# Patient Record
Sex: Male | Born: 1945 | Race: White | Hispanic: No | Marital: Married | Smoking: Former smoker
Health system: Southern US, Community
[De-identification: ages and names within clinical notes are randomized; demographics above are authoritative.]

## PROBLEM LIST (undated history)

## (undated) DIAGNOSIS — E669 Obesity, unspecified: Secondary | ICD-10-CM

## (undated) DIAGNOSIS — K5730 Diverticulosis of large intestine without perforation or abscess without bleeding: Secondary | ICD-10-CM

## (undated) DIAGNOSIS — N400 Enlarged prostate without lower urinary tract symptoms: Secondary | ICD-10-CM

## (undated) DIAGNOSIS — Z7289 Other problems related to lifestyle: Secondary | ICD-10-CM

## (undated) DIAGNOSIS — I499 Cardiac arrhythmia, unspecified: Secondary | ICD-10-CM

## (undated) DIAGNOSIS — I10 Essential (primary) hypertension: Secondary | ICD-10-CM

## (undated) DIAGNOSIS — F1090 Alcohol use, unspecified, uncomplicated: Secondary | ICD-10-CM

## (undated) DIAGNOSIS — E785 Hyperlipidemia, unspecified: Secondary | ICD-10-CM

## (undated) DIAGNOSIS — C61 Malignant neoplasm of prostate: Secondary | ICD-10-CM

## (undated) DIAGNOSIS — G4733 Obstructive sleep apnea (adult) (pediatric): Secondary | ICD-10-CM

## (undated) DIAGNOSIS — M1990 Unspecified osteoarthritis, unspecified site: Secondary | ICD-10-CM

## (undated) DIAGNOSIS — I420 Dilated cardiomyopathy: Secondary | ICD-10-CM

## (undated) DIAGNOSIS — I4892 Unspecified atrial flutter: Secondary | ICD-10-CM

## (undated) DIAGNOSIS — I4891 Unspecified atrial fibrillation: Secondary | ICD-10-CM

## (undated) HISTORY — DX: Obstructive sleep apnea (adult) (pediatric): G47.33

## (undated) HISTORY — DX: Dilated cardiomyopathy: I42.0

## (undated) HISTORY — DX: Unspecified osteoarthritis, unspecified site: M19.90

## (undated) HISTORY — PX: COLONOSCOPY: SHX5424

## (undated) HISTORY — DX: Diverticulosis of large intestine without perforation or abscess without bleeding: K57.30

## (undated) HISTORY — PX: TONSILLECTOMY: SUR1361

## (undated) HISTORY — DX: Essential (primary) hypertension: I10

---

## 1979-07-13 HISTORY — PX: OTHER SURGICAL HISTORY: SHX169

## 1989-07-12 HISTORY — PX: CARPAL TUNNEL RELEASE: SHX101

## 2011-02-28 HISTORY — PX: PROSTATE BIOPSY: SHX241

## 2011-05-31 ENCOUNTER — Other Ambulatory Visit (HOSPITAL_COMMUNITY): Payer: Self-pay | Admitting: Urology

## 2011-05-31 DIAGNOSIS — Z8546 Personal history of malignant neoplasm of prostate: Secondary | ICD-10-CM

## 2011-06-18 ENCOUNTER — Ambulatory Visit (HOSPITAL_COMMUNITY)
Admission: RE | Admit: 2011-06-18 | Discharge: 2011-06-18 | Disposition: A | Discharge status: Home / Self Care | Payer: MEDICARE | Source: Ambulatory Visit | Attending: Urology | Admitting: Urology

## 2011-06-18 DIAGNOSIS — K5730 Diverticulosis of large intestine without perforation or abscess without bleeding: Secondary | ICD-10-CM | POA: Insufficient documentation

## 2011-06-18 DIAGNOSIS — Z8546 Personal history of malignant neoplasm of prostate: Secondary | ICD-10-CM

## 2011-06-18 DIAGNOSIS — C61 Malignant neoplasm of prostate: Secondary | ICD-10-CM | POA: Insufficient documentation

## 2011-06-18 MED ORDER — GADOBENATE DIMEGLUMINE 529 MG/ML IV SOLN
20.0000 mL | Freq: Once | INTRAVENOUS | Status: AC | PRN
  Administered 2011-06-18: 20 mL via INTRAVENOUS

## 2012-02-05 ENCOUNTER — Ambulatory Visit
Admission: RE | Admit: 2012-02-05 | Discharge: 2012-02-05 | Disposition: A | Discharge status: Home / Self Care | Payer: Medicare Other | Source: Ambulatory Visit | Attending: Radiation Oncology | Admitting: Radiation Oncology

## 2012-02-05 ENCOUNTER — Encounter: Payer: Self-pay | Admitting: Radiation Oncology

## 2012-02-05 VITALS — BP 126/81 | HR 83 | Temp 97.0°F | Resp 18 | Ht 70.0 in | Wt 221.1 lb

## 2012-02-05 DIAGNOSIS — E7800 Pure hypercholesterolemia, unspecified: Secondary | ICD-10-CM | POA: Insufficient documentation

## 2012-02-05 DIAGNOSIS — Z7982 Long term (current) use of aspirin: Secondary | ICD-10-CM | POA: Insufficient documentation

## 2012-02-05 DIAGNOSIS — I10 Essential (primary) hypertension: Secondary | ICD-10-CM | POA: Insufficient documentation

## 2012-02-05 DIAGNOSIS — C61 Malignant neoplasm of prostate: Secondary | ICD-10-CM | POA: Insufficient documentation

## 2012-02-05 DIAGNOSIS — Z87891 Personal history of nicotine dependence: Secondary | ICD-10-CM | POA: Insufficient documentation

## 2012-02-05 DIAGNOSIS — Z79899 Other long term (current) drug therapy: Secondary | ICD-10-CM | POA: Insufficient documentation

## 2012-02-05 NOTE — Progress Notes (Signed)
66 year old male. Married.  Prostate biopsy done 02/28/11 revealed Gleason 3+3=6 adenocarcinoma of the prostate in 6 out of 12 biopsy cores. Right apex (50%), R lateral apex (60%), R mid (20%) R lateral mid (25%), R base (35%), R lateral base (80%).  cT2a NxMx Prostate volume 54.8 cc  09/2008 PSA  3.11 12/2009   PSA  4.06 01/01/11  PSA  4.76  NKDA No indication of a pacemaker No hx of radiation thearpy

## 2012-02-05 NOTE — Progress Notes (Signed)
Complete PATIENT MEASURE OF DISTRESS worksheet with a score of 0 submitted to social work. Also, complete NUTRITION RISK SCREEN without concerns submitted to Zenovia Jarred, RD.

## 2012-02-05 NOTE — Progress Notes (Signed)
Encounter addended by: Maryln Gottron, MD on: 02/05/2012  9:57 PM<BR>     Documentation filed: Notes Section

## 2012-02-05 NOTE — Progress Notes (Signed)
See progress note under physician encounter. 

## 2012-02-05 NOTE — Progress Notes (Signed)
Patient presents to the clinic today accompanied by his wife of 32 years for a new consult with Dr. Dayton Scrape reference prostate cancer. Patient is alert and oriented to person, place, and time. No distress noted.Steady gait noted. Pleasant affect noted. Patient reports right shoulder, left hip and left knee pain related effects of arthritis. Patient denies nausea, vomiting, headache or diarrhea. Patient reports that his weight has remained stable. Patient reports eating and sleeping without difficulty. Patient reports occasional urgency and weak stream. Patient denies burning with urination or hematuria. IPSS of 2 noted. Reported all findings to Dr. Dayton Scrape.
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
## 2012-02-07 NOTE — Progress Notes (Signed)
Encounter addended by: Agnes Lawrence, RN on: 02/07/2012 10:59 AM<BR>     Documentation filed: Charges VN, Inpatient Patient Education

## 2012-03-23 ENCOUNTER — Other Ambulatory Visit: Payer: Self-pay | Admitting: Urology

## 2012-07-07 DIAGNOSIS — C61 Malignant neoplasm of prostate: Secondary | ICD-10-CM | POA: Insufficient documentation

## 2012-07-16 ENCOUNTER — Other Ambulatory Visit: Payer: Self-pay | Admitting: Urology

## 2012-07-17 ENCOUNTER — Encounter (HOSPITAL_COMMUNITY): Payer: Self-pay | Admitting: Pharmacy Technician

## 2012-07-23 ENCOUNTER — Encounter (HOSPITAL_COMMUNITY)
Admission: RE | Admit: 2012-07-23 | Discharge: 2012-07-23 | Disposition: A | Discharge status: Home / Self Care | Payer: Medicare Other | Source: Ambulatory Visit | Attending: Urology | Admitting: Urology

## 2012-07-23 ENCOUNTER — Encounter (HOSPITAL_COMMUNITY): Payer: Self-pay

## 2012-07-23 ENCOUNTER — Ambulatory Visit (HOSPITAL_COMMUNITY)
Admission: RE | Admit: 2012-07-23 | Discharge: 2012-07-23 | Disposition: A | Discharge status: Home / Self Care | Payer: Medicare Other | Source: Ambulatory Visit | Attending: Urology | Admitting: Urology

## 2012-07-23 DIAGNOSIS — Z01812 Encounter for preprocedural laboratory examination: Secondary | ICD-10-CM | POA: Insufficient documentation

## 2012-07-23 DIAGNOSIS — C61 Malignant neoplasm of prostate: Secondary | ICD-10-CM | POA: Insufficient documentation

## 2012-07-23 DIAGNOSIS — Z01810 Encounter for preprocedural cardiovascular examination: Secondary | ICD-10-CM | POA: Insufficient documentation

## 2012-07-23 LAB — CBC
HCT: 45.8 % (ref 39.0–52.0)
Hemoglobin: 15.7 g/dL (ref 13.0–17.0)
MCH: 29.8 pg (ref 26.0–34.0)
MCHC: 34.3 g/dL (ref 30.0–36.0)

## 2012-07-23 LAB — BASIC METABOLIC PANEL
BUN: 14 mg/dL (ref 6–23)
Creatinine, Ser: 0.79 mg/dL (ref 0.50–1.35)
GFR calc non Af Amer: >90 mL/min (ref >90)
Glucose, Bld: 104 mg/dL — ABNORMAL HIGH (ref 70–99)
Potassium: 4.4 mEq/L (ref 3.5–5.1)

## 2012-07-23 NOTE — Pre-Procedure Instructions (Signed)
Reviewed with patient pre-op instructions using Teach back method. 

## 2012-07-23 NOTE — Progress Notes (Signed)
07/23/12 1011  OBSTRUCTIVE SLEEP APNEA  Have you ever been diagnosed with sleep apnea through a sleep study? No  Do you snore loudly (loud enough to be heard through closed doors)?  1  Do you often feel tired, fatigued, or sleepy during the daytime? 0  Has anyone observed you stop breathing during your sleep? 0  Do you have, or are you being treated for high blood pressure? 1  BMI more than 35 kg/m2? 0  Age over 68 years old? 1  Neck circumference greater than 40 cm/18 inches? 0  Gender: 1  Obstructive Sleep Apnea Score 4   Score 4 or greater  Updated health history

## 2012-07-23 NOTE — Patient Instructions (Signed)
20 Travis Chase  07/23/2012   Your procedure is scheduled on:  Monday 07/27/2012 at 1100 am  Report to [facility] at 0830 AM.  Call this number if you have problems the morning of surgery: [phone]   Remember:FOLLOW BOWEL PREP INSTRUCTIONS FROM DR.BORDEN AS INSTRUCTED   Do not eat food:AFTER SAT. 07/25/2012-FOLLOW CLEAR LIQUID DIET ON SUN. 07/26/2012  May have clear liquids:until Midnight .    Take these medicines the morning of surgery with A SIP OF WATER: NONE   Do not wear jewelry  Do not wear lotions, powders, or perfumes.  Do not shave 48 hours prior to surgery. Men may shave face and neck.  Do not bring valuables to the hospital.  Contacts, dentures or bridgework may not be worn into surgery.  Leave suitcase in the car. After surgery it may be brought to your room.  For patients admitted to the hospital, checkout time is 11:00 AM the day of discharge.       Special Instructions: CHG Shower Use Special Wash: 1/2 bottle night before surgery and 1/2 bottle morning of surgery.   Please read over the following fact sheets that you were given: MRSA Information, Sleep apnea sheet, Incentive spirometry sheet, clear liquid diet                If you have any questions, please call me at [phone] [facility].Georgeanna Lea, Charity fundraiser, BSN

## 2012-07-26 NOTE — H&P (Signed)
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 He has no family history of prostate cancer.  He is relatively healthy with a history of arthritis, hypertension, and dyslipidemia. He initially did not wish to proceed with treatment of curative intent and was interested in surveillance although did not followup as recommended.  He presented again 6 months after his initial diagnosis and was interested in further discussing his options. He has discussed his options with Dr. Dayton Scrape and myself and has elected to proceed with surgical therapy.  TNM stage: cT2a Nx Mx PSA: 4.76 Gleason score: 3+3=6 Biopsy (02/28/11): 6/12 cores -- R apex (50%), R lateral apex (60%), R mid (20%), R lateral mid (25%), R base (35%), R lateral base (80%) Prostate volume: 54.8 cc  MSK nomogram: Indolent disease: 19% OC disease: 84% EPE: 19% SVI: 1% LNI: 1.4% PFS: 97%, 95%  Urinary function: He has a history of BPH and his symptoms are well controlled with Cardura 2 mg. IPSS: 3. Erectile function: He has mild erectile dysfunction and has not required treatment. SHIM score: 20.  Interval history:  He follows up today for further discussion regarding surgical treatment of his prostate cancer. He did inform me a few months ago that he did decide on surgical treatment after contemplating active surveillance as an alternative option. He is scheduled for his prostatectomy next week and follows up today for further discussion regarding this procedure. He has remained in excellent health since his last visit.     Past Medical History Problems  1. History of  Arthritis V13.4 2. History of  Benign Prostatic Hypertrophy 600.00 3. History of  Diverticulosis 562.10 4. History of  Hypercholesterolemia 272.0 5. History of  Hypertension  401.9 6. Prostate Cancer 185  Surgical History Problems  1. History of  Wrist Arthroscopy With Release Of Transverse Carpal Ligament Right  Current Meds 1. Aleve CAPS; Therapy: (Recorded:21Mar2013) to 2. Aspir-81 81 MG Oral Tablet Delayed Release; Therapy: (Recorded:20Mar2012) to 3. Cardura 2 MG Oral Tablet; Therapy: (Recorded:20Mar2012) to 4. CeleBREX CAPS; Therapy: (Recorded:21Mar2012) to 5. Fish Oil Concentrate 1000 MG Oral Capsule; Therapy: (Recorded:20Mar2012) to 6. Ibuprofen CAPS; Therapy: (Recorded:20Mar2012) to 7. Tylenol CAPS; Therapy: (Recorded:20Mar2012) to  Allergies Medication  1. No Known Drug Allergies  Family History Problems  1. Paternal history of  Acute Myocardial Infarction V17.3 2. Paternal history of  Alcohol Abuse 3. Family history of  Death In The Family Father 63; heart attack 4. Family history of  Death In The Family Mother 64 5. Paternal aunt's history of  Diabetes Mellitus V18.0 6. Fraternal history of  Hypertension V17.49 7. Paternal history of  Malignant Melanoma Of The Skin V16.8  Vitals Vital Signs [Data Includes: Last 1 Day]  10Sep2013 11:01AM  BMI Calculated: 30.78 BSA Calculated: 2.15 Height: 5 ft 10 in Weight: 215 lb  Blood Pressure: 103 / 63 Temperature: 98.4 F Heart Rate: 79  Physical Exam Constitutional: Well nourished and well developed . No acute distress.  ENT:. The ears and nose are normal in appearance.  Neck: The appearance of the neck is normal and no neck mass is present.  Pulmonary: No respiratory distress, normal respiratory rhythm and effort and clear bilateral breath sounds.  Cardiovascular: Heart rate and rhythm are normal . No peripheral edema.  Abdomen: The abdomen is  rounded. The abdomen is soft and nontender. No masses are palpated. No CVA tenderness. No hernias are palpable. No hepatosplenomegaly noted.  Rectal: Prostate size is estimated to be 50 g. Normal rectal tone, no rectal masses, prostate is smooth,  symmetric and non-tender.  Skin: Normal skin turgor, no visible rash and no visible skin lesions.  Neuro/Psych:. Mood and affect are appropriate.    Results/Data Urine [Data Includes: Last 1 Day]   10Sep2013 COLOR YELLOW  APPEARANCE CLEAR  SPECIFIC GRAVITY 1.015  pH 7.0  GLUCOSE NEG mg/dL BILIRUBIN NEG  KETONE NEG mg/dL BLOOD NEG  PROTEIN NEG mg/dL UROBILINOGEN 0.2 mg/dL NITRITE NEG  LEUKOCYTE ESTERASE NEG   Plan Health Maintenance (V70.0)  1. UA With REFLEX  Done: 10Sep2013 10:49AM Prostate Cancer (185)  2. Follow-up Schedule Surgery Office  Follow-up  Requested for: 10Sep2013  Discussion/Summary  1. Prostate cancer:   The patient was counseled about the natural history of prostate cancer and the standard treatment options that are available for prostate cancer. It was explained to him how his age and life expectancy, clinical stage, Gleason score, and PSA affect his prognosis, the decision to proceed with additional staging studies, as well as how that information influences recommended treatment strategies. We discussed the roles for active surveillance, radiation therapy, surgical therapy, androgen deprivation, as well as ablative therapy options for the treatment of prostate cancer as appropriate to his individual cancer situation. We discussed the risks and benefits of these options with regard to their impact on cancer control and also in terms of potential adverse events, complications, and impact on quiality of life particularly related to urinary, bowel, and sexual function. The patient was encouraged to ask questions throughout the discussion today and all questions were answered to his stated satisfaction. In addition, the patient was provided with and/or directed to appropriate resources and literature for further education about prostate cancer and treatment options.   We discussed surgical therapy for prostate cancer including the different available surgical approaches.  We discussed, in detail, the risks and expectations of surgery with regard to cancer control, urinary control, and erectile function as well as the expected postoperative recovery process. Additional risks of surgery including but not limited to bleeding, infection, hernia formation, nerve damage, lymphocele formation, bowel/rectal injury potentially necessitating colostomy, damage to the urinary tract resulting in urine leakage, urethral stricture, and the cardiopulmonary risks such as myocardial infarction, stroke, death, venothromboembolism, etc. were explained. The risk of open surgical conversion for robotic/laparoscopic prostatectomy was also discussed.   All of his questions were answered to his stated satisfaction today. He does wish to proceed as planned and will undergo a bilateral nerve sparing robotic-assisted laparoscopic radical prostatectomy. He does understand the possible risk of cancer progression since his biopsy which was over a year ago.    CC: Dr. Mila Palmer  a total of 45 minutes were spent in the overall care of the patient today Amended By: Heloise Purpura; 07/21/2012 11:47 AMEST with 40 minutes in direct face to face consultation. Amended By: Heloise Purpura; 07/21/2012 11:47 AMEST    Signatures Electronically signed by : Heloise Purpura, M.D.; Jul 21 2012 11:47AM

## 2012-07-27 ENCOUNTER — Inpatient Hospital Stay (HOSPITAL_COMMUNITY)
Admission: RE | Admit: 2012-07-27 | Discharge: 2012-07-28 | DRG: 708 | Disposition: A | Discharge status: Home / Self Care | Payer: Medicare Other | Source: Ambulatory Visit | Attending: Urology | Admitting: Urology

## 2012-07-27 ENCOUNTER — Encounter (HOSPITAL_COMMUNITY)
Admission: RE | Disposition: A | Discharge status: Home / Self Care | Payer: Self-pay | Source: Ambulatory Visit | Attending: Urology

## 2012-07-27 ENCOUNTER — Encounter (HOSPITAL_COMMUNITY): Payer: Self-pay

## 2012-07-27 ENCOUNTER — Inpatient Hospital Stay (HOSPITAL_COMMUNITY): Payer: Medicare Other | Admitting: Anesthesiology

## 2012-07-27 ENCOUNTER — Encounter (HOSPITAL_COMMUNITY): Payer: Self-pay | Admitting: Anesthesiology

## 2012-07-27 DIAGNOSIS — E669 Obesity, unspecified: Secondary | ICD-10-CM | POA: Diagnosis present

## 2012-07-27 DIAGNOSIS — I10 Essential (primary) hypertension: Secondary | ICD-10-CM | POA: Diagnosis present

## 2012-07-27 DIAGNOSIS — C61 Malignant neoplasm of prostate: Principal | ICD-10-CM | POA: Diagnosis present

## 2012-07-27 HISTORY — PX: ROBOT ASSISTED LAPAROSCOPIC RADICAL PROSTATECTOMY: SHX5141

## 2012-07-27 LAB — HEMOGLOBIN AND HEMATOCRIT, BLOOD
HCT: 41.5 % (ref 39.0–52.0)
Hemoglobin: 14.3 g/dL (ref 13.0–17.0)

## 2012-07-27 SURGERY — ROBOTIC ASSISTED LAPAROSCOPIC RADICAL PROSTATECTOMY LEVEL 1
Anesthesia: General | Site: Prostate | Wound class: Clean Contaminated

## 2012-07-27 MED ORDER — INFLUENZA VIRUS VACC SPLIT PF IM SUSP
0.5000 mL | INTRAMUSCULAR | Status: AC
  Administered 2012-07-28: 0.5 mL via INTRAMUSCULAR
  Filled 2012-07-27: qty 0.5

## 2012-07-27 MED ORDER — LACTATED RINGERS IV SOLN
INTRAVENOUS | Status: DC | PRN
  Administered 2012-07-27: 11:00:00

## 2012-07-27 MED ORDER — DIPHENHYDRAMINE HCL 12.5 MG/5ML PO ELIX
12.5000 mg | ORAL_SOLUTION | Freq: Four times a day (QID) | ORAL | Status: DC | PRN
  Filled 2012-07-27: qty 10

## 2012-07-27 MED ORDER — BELLADONNA ALKALOIDS-OPIUM 16.2-60 MG RE SUPP
1.0000 | Freq: Four times a day (QID) | RECTAL | Status: DC | PRN
  Administered 2012-07-27: 1 via RECTAL
  Filled 2012-07-27: qty 1

## 2012-07-27 MED ORDER — PROPOFOL 10 MG/ML IV BOLUS
INTRAVENOUS | Status: DC | PRN
  Administered 2012-07-27: 150 mg via INTRAVENOUS

## 2012-07-27 MED ORDER — LACTATED RINGERS IV SOLN
INTRAVENOUS | Status: DC | PRN
  Administered 2012-07-27 (×2): via INTRAVENOUS

## 2012-07-27 MED ORDER — GLYCOPYRROLATE 0.2 MG/ML IJ SOLN
INTRAMUSCULAR | Status: DC | PRN
  Administered 2012-07-27: .6 mg via INTRAVENOUS

## 2012-07-27 MED ORDER — LIDOCAINE HCL (CARDIAC) 20 MG/ML IV SOLN
INTRAVENOUS | Status: DC | PRN
  Administered 2012-07-27: 100 mg via INTRAVENOUS

## 2012-07-27 MED ORDER — CEFAZOLIN SODIUM 1-5 GM-% IV SOLN
1.0000 g | Freq: Three times a day (TID) | INTRAVENOUS | Status: AC
  Administered 2012-07-27 (×2): 1 g via INTRAVENOUS
  Filled 2012-07-27 (×2): qty 50

## 2012-07-27 MED ORDER — BUPIVACAINE-EPINEPHRINE 0.25% -1:200000 IJ SOLN
INTRAMUSCULAR | Status: DC | PRN
  Administered 2012-07-27: 30 mL

## 2012-07-27 MED ORDER — NEOSTIGMINE METHYLSULFATE 1 MG/ML IJ SOLN
INTRAMUSCULAR | Status: DC | PRN
  Administered 2012-07-27: 4 mg via INTRAVENOUS

## 2012-07-27 MED ORDER — PNEUMOCOCCAL VAC POLYVALENT 25 MCG/0.5ML IJ INJ
0.5000 mL | INJECTION | INTRAMUSCULAR | Status: AC
  Administered 2012-07-28: 0.5 mL via INTRAMUSCULAR
  Filled 2012-07-27: qty 0.5

## 2012-07-27 MED ORDER — INDIGOTINDISULFONATE SODIUM 8 MG/ML IJ SOLN
INTRAMUSCULAR | Status: DC | PRN
  Administered 2012-07-27 (×2): 5 mL via INTRAVENOUS

## 2012-07-27 MED ORDER — SUFENTANIL CITRATE 50 MCG/ML IV SOLN
INTRAVENOUS | Status: DC | PRN
  Administered 2012-07-27: 20 ug via INTRAVENOUS
  Administered 2012-07-27 (×3): 10 ug via INTRAVENOUS

## 2012-07-27 MED ORDER — STERILE WATER FOR IRRIGATION IR SOLN
Status: DC | PRN
  Administered 2012-07-27: 3000 mL

## 2012-07-27 MED ORDER — ACETAMINOPHEN 10 MG/ML IV SOLN
INTRAVENOUS | Status: DC | PRN
  Administered 2012-07-27: 1000 mg via INTRAVENOUS

## 2012-07-27 MED ORDER — SODIUM CHLORIDE 0.9 % IR SOLN
Status: DC | PRN
  Administered 2012-07-27: 1000 mL

## 2012-07-27 MED ORDER — DOXAZOSIN MESYLATE 2 MG PO TABS
2.0000 mg | ORAL_TABLET | Freq: Every day | ORAL | Status: DC
  Administered 2012-07-27: 2 mg via ORAL
  Filled 2012-07-27 (×3): qty 1

## 2012-07-27 MED ORDER — DIPHENHYDRAMINE HCL 50 MG/ML IJ SOLN: 12.5000 mg | Freq: Four times a day (QID) | INTRAMUSCULAR | Status: DC | PRN

## 2012-07-27 MED ORDER — MORPHINE SULFATE 10 MG/ML IJ SOLN: 2.0000 mg | INTRAMUSCULAR | Status: DC | PRN

## 2012-07-27 MED ORDER — KCL IN DEXTROSE-NACL 20-5-0.45 MEQ/L-%-% IV SOLN
INTRAVENOUS | Status: DC
  Administered 2012-07-27: 150 mL/h via INTRAVENOUS
  Filled 2012-07-27 (×5): qty 1000

## 2012-07-27 MED ORDER — PROMETHAZINE HCL 25 MG/ML IJ SOLN: 6.2500 mg | INTRAMUSCULAR | Status: DC | PRN

## 2012-07-27 MED ORDER — ONDANSETRON HCL 4 MG/2ML IJ SOLN
INTRAMUSCULAR | Status: DC | PRN
  Administered 2012-07-27: 4 mg via INTRAVENOUS

## 2012-07-27 MED ORDER — DOCUSATE SODIUM 100 MG PO CAPS
100.0000 mg | ORAL_CAPSULE | Freq: Two times a day (BID) | ORAL | Status: DC
  Administered 2012-07-27: 100 mg via ORAL
  Filled 2012-07-27 (×5): qty 1

## 2012-07-27 MED ORDER — LACTATED RINGERS IR SOLN: Status: DC | PRN

## 2012-07-27 MED ORDER — HYDROMORPHONE HCL PF 1 MG/ML IJ SOLN
0.2500 mg | INTRAMUSCULAR | Status: DC | PRN
  Administered 2012-07-27: 0.5 mg via INTRAVENOUS
  Administered 2012-07-27: 0.25 mg via INTRAVENOUS
  Administered 2012-07-27 (×2): 0.5 mg via INTRAVENOUS
  Administered 2012-07-27: 0.25 mg via INTRAVENOUS
  Administered 2012-07-27: 0.5 mg via INTRAVENOUS
  Administered 2012-07-27: 0.25 mg via INTRAVENOUS

## 2012-07-27 MED ORDER — MIDAZOLAM HCL 5 MG/5ML IJ SOLN
INTRAMUSCULAR | Status: DC | PRN
  Administered 2012-07-27: 2 mg via INTRAVENOUS

## 2012-07-27 MED ORDER — KETOROLAC TROMETHAMINE 15 MG/ML IJ SOLN
15.0000 mg | Freq: Four times a day (QID) | INTRAMUSCULAR | Status: DC
  Administered 2012-07-27 – 2012-07-28 (×4): 15 mg via INTRAVENOUS
  Filled 2012-07-27 (×5): qty 1

## 2012-07-27 MED ORDER — CISATRACURIUM BESYLATE (PF) 10 MG/5ML IV SOLN
INTRAVENOUS | Status: DC | PRN
  Administered 2012-07-27: 2 mg via INTRAVENOUS
  Administered 2012-07-27: 18 mg via INTRAVENOUS

## 2012-07-27 MED ORDER — ACETAMINOPHEN 325 MG PO TABS: 650.0000 mg | ORAL_TABLET | ORAL | Status: DC | PRN

## 2012-07-27 MED ORDER — CEFAZOLIN SODIUM-DEXTROSE 2-3 GM-% IV SOLR
2.0000 g | INTRAVENOUS | Status: AC
  Administered 2012-07-27: 2 g via INTRAVENOUS

## 2012-07-27 MED ORDER — HYDROMORPHONE HCL PF 1 MG/ML IJ SOLN
0.5000 mg | INTRAMUSCULAR | Status: DC | PRN
  Administered 2012-07-27: 0.5 mg via INTRAVENOUS

## 2012-07-27 MED ORDER — SODIUM CHLORIDE 0.9 % IV BOLUS (SEPSIS)
1000.0000 mL | Freq: Once | INTRAVENOUS | Status: AC
  Administered 2012-07-27: 1000 mL via INTRAVENOUS

## 2012-07-27 MED ORDER — HYDROMORPHONE HCL PF 1 MG/ML IJ SOLN
INTRAMUSCULAR | Status: DC | PRN
  Administered 2012-07-27 (×2): 1 mg via INTRAVENOUS

## 2012-07-27 SURGICAL SUPPLY — 35 items
CANISTER SUCTION 2500CC (MISCELLANEOUS) ×2 IMPLANT
CATH ROBINSON RED A/P 8FR (CATHETERS) ×2 IMPLANT
CHLORAPREP W/TINT 26ML (MISCELLANEOUS) ×2 IMPLANT
CLIP LIGATING HEM O LOK PURPLE (MISCELLANEOUS) IMPLANT
CLOTH BEACON ORANGE TIMEOUT ST (SAFETY) ×2 IMPLANT
CORD HIGH FREQUENCY UNIPOLAR (ELECTROSURGICAL) ×2 IMPLANT
COVER SURGICAL LIGHT HANDLE (MISCELLANEOUS) ×4 IMPLANT
COVER TIP SHEARS 8 DVNC (MISCELLANEOUS) ×1 IMPLANT
COVER TIP SHEARS 8MM DA VINCI (MISCELLANEOUS) ×1
CUTTER ECHEON FLEX ENDO 45 340 (ENDOMECHANICALS) ×2 IMPLANT
DECANTER SPIKE VIAL GLASS SM (MISCELLANEOUS) ×2 IMPLANT
DRAPE SURG IRRIG POUCH 19X23 (DRAPES) ×2 IMPLANT
DRSG TEGADERM 6X8 (GAUZE/BANDAGES/DRESSINGS) ×4 IMPLANT
ELECT REM PT RETURN 9FT ADLT (ELECTROSURGICAL) ×2
ELECTRODE REM PT RTRN 9FT ADLT (ELECTROSURGICAL) ×1 IMPLANT
GLOVE BIO SURGEON STRL SZ 6.5 (GLOVE) ×4 IMPLANT
GLOVE BIOGEL M STRL SZ7.5 (GLOVE) ×4 IMPLANT
GOWN STRL NON-REIN LRG LVL3 (GOWN DISPOSABLE) ×6 IMPLANT
GOWN STRL REIN XL XLG (GOWN DISPOSABLE) ×4 IMPLANT
HOLDER FOLEY CATH W/STRAP (MISCELLANEOUS) ×2 IMPLANT
IV LACTATED RINGERS 1000ML (IV SOLUTION) ×2 IMPLANT
KIT ACCESSORY DA VINCI DISP (KITS) ×1
KIT ACCESSORY DVNC DISP (KITS) ×1 IMPLANT
NDL SAFETY ECLIPSE 18X1.5 (NEEDLE) ×1 IMPLANT
NEEDLE HYPO 18GX1.5 SHARP (NEEDLE) ×1
PACK ROBOT UROLOGY CUSTOM (CUSTOM PROCEDURE TRAY) ×2 IMPLANT
RELOAD GREEN ECHELON 45 (STAPLE) ×2 IMPLANT
SET TUBE IRRIG SUCTION NO TIP (IRRIGATION / IRRIGATOR) ×2 IMPLANT
SOLUTION ELECTROLUBE (MISCELLANEOUS) ×2 IMPLANT
SPONGE GAUZE 4X4 12PLY (GAUZE/BANDAGES/DRESSINGS) IMPLANT
SUT ETHILON 3 0 PS 1 (SUTURE) ×2 IMPLANT
SUT VICRYL 0 UR6 27IN ABS (SUTURE) ×6 IMPLANT
SYR 27GX1/2 1ML LL SAFETY (SYRINGE) ×2 IMPLANT
TOWEL OR NON WOVEN STRL DISP B (DISPOSABLE) ×2 IMPLANT
WATER STERILE IRR 1500ML POUR (IV SOLUTION) ×4 IMPLANT

## 2012-07-27 NOTE — Anesthesia Preprocedure Evaluation (Signed)
Anesthesia Evaluation  Patient identified by MRN, date of birth, ID band Patient awake    Reviewed: Allergy & Precautions, H&P , NPO status , Patient's Chart, lab work & pertinent test results  Airway Mallampati: II TM Distance: >3 FB Neck ROM: Full    Dental No notable dental hx.    Pulmonary neg pulmonary ROS,  breath sounds clear to auscultation  Pulmonary exam normal       Cardiovascular hypertension, Pt. on medications negative cardio ROS  Rhythm:Regular Rate:Normal     Neuro/Psych negative neurological ROS  negative psych ROS   GI/Hepatic negative GI ROS, Neg liver ROS,   Endo/Other  negative endocrine ROS  Renal/GU negative Renal ROS  negative genitourinary   Musculoskeletal negative musculoskeletal ROS (+)   Abdominal (+) + obese,   Peds negative pediatric ROS (+)  Hematology negative hematology ROS (+)   Anesthesia Other Findings   Reproductive/Obstetrics negative OB ROS                           Anesthesia Physical Anesthesia Plan  ASA: II  Anesthesia Plan: General   Post-op Pain Management:    Induction: Intravenous  Airway Management Planned: Oral ETT  Additional Equipment:   Intra-op Plan:   Post-operative Plan: Extubation in OR  Informed Consent: I have reviewed the patients History and Physical, chart, labs and discussed the procedure including the risks, benefits and alternatives for the proposed anesthesia with the patient or authorized representative who has indicated his/her understanding and acceptance.   Dental advisory given  Plan Discussed with: CRNA  Anesthesia Plan Comments:         Anesthesia Quick Evaluation

## 2012-07-27 NOTE — Op Note (Signed)
Preoperative diagnosis: Clinically localized adenocarcinoma of the prostate (clinical stage cT1c Nx Mx)  Postoperative diagnosis: Clinically localized adenocarcinoma of the prostate (clinical stage cT1c Nx Mx)  Procedure:  1. Robotic assisted laparoscopic radical prostatectomy (bilateral nerve sparing)  Surgeon: Rolly Salter, Montez Hageman. M.D.  Assistant: Pecola Leisure, PA-C  Anesthesia: General  Complications: None  EBL: 150 mL  IVF:  1100 mL crystalloid  Specimens: 1. Prostate and seminal vesicles  Disposition of specimens: Pathology  Drains: 1. 20 Fr coude catheter 2. # 19 Blake pelvic drain  Indication: Travis Chase is a 66 y.o. year old patient with clinically localized prostate cancer.  After a thorough review of the management options for treatment of prostate cancer, he elected to proceed with surgical therapy and the above procedure(s).  We have discussed the potential benefits and risks of the procedure, side effects of the proposed treatment, the likelihood of the patient achieving the goals of the procedure, and any potential problems that might occur during the procedure or recuperation. Informed consent has been obtained.  Description of procedure:  The patient was taken to the operating room and a general anesthetic was administered. He was given preoperative antibiotics, placed in the dorsal lithotomy position, and prepped and draped in the usual sterile fashion. Next a preoperative timeout was performed. A urethral catheter was placed into the bladder and a site was selected near the umbilicus for placement of the camera port. This was placed using a standard open Hassan technique which allowed entry into the peritoneal cavity under direct vision and without difficulty. A 12 mm port was placed and a pneumoperitoneum established. The camera was then used to inspect the abdomen and there was no evidence of any intra-abdominal injuries or other abnormalities. The  remaining abdominal ports were then placed. 8 mm robotic ports were placed in the right lower quadrant, left lower quadrant, and far left lateral abdominal wall. A 5 mm port was placed in the right upper quadrant and a 12 mm port was placed in the right lateral abdominal wall for laparoscopic assistance. All ports were placed under direct vision without difficulty. The surgical cart was then docked.   Utilizing the cautery scissors, the bladder was reflected posteriorly allowing entry into the space of Retzius and identification of the endopelvic fascia and prostate. The periprostatic fat was then removed from the prostate allowing full exposure of the endopelvic fascia. The endopelvic fascia was then incised from the apex back to the base of the prostate bilaterally and the underlying levator muscle fibers were swept laterally off the prostate thereby isolating the dorsal venous complex. The dorsal vein was then stapled and divided with a 45 mm Flex Echelon stapler. Attention then turned to the bladder neck which was divided anteriorly thereby allowing entry into the bladder and exposure of the urethral catheter. The catheter balloon was deflated and the catheter was brought into the operative field and used to retract the prostate anteriorly. The posterior bladder neck was then examined and was divided allowing further dissection between the bladder and prostate posteriorly until the vasa deferentia and seminal vessels were identified. The vasa deferentia were isolated, divided, and lifted anteriorly. The seminal vesicles were dissected down to their tips with care to control the seminal vascular arterial blood supply. These structures were then lifted anteriorly and the space between Denonvillier's fascia and the anterior rectum was developed with a combination of sharp and blunt dissection. This isolated the vascular pedicles of the prostate.  The lateral prostatic fascia  was then sharply incised allowing  release of the neurovascular bundles bilaterally. The vascular pedicles of the prostate were then ligated with Weck clips between the prostate and neurovascular bundles and divided with sharp cold scissor dissection resulting in neurovascular bundle preservation. The neurovascular bundles were then separated off the apex of the prostate and urethra bilaterally.  The urethra was then sharply transected allowing the prostate specimen to be disarticulated. The pelvis was copiously irrigated and hemostasis was ensured. There was no evidence for rectal injury.  Attention then turned to the urethral anastomosis. A 2-0 Vicryl slip knot was placed between Denonvillier's fascia, the posterior bladder neck, and the posterior urethra to reapproximate these structures. A double-armed 3-0 Monocryl suture was then used to perform a 360 running tension-free anastomosis between the bladder neck and urethra. A new urethral catheter was then placed into the bladder and irrigated. There were no blood clots within the bladder and the anastomosis appeared to be watertight. A #19 Blake drain was then brought through the left lateral 8 mm port site and positioned appropriately within the pelvis. It was secured to the skin with a nylon suture. The surgical cart was then undocked. The right lateral 12 mm port site was closed at the fascial level with a 0 Vicryl suture placed laparoscopically. All remaining ports were then removed under direct vision. The prostate specimen was removed intact within the Endopouch retrieval bag via the periumbilical camera port site. This fascial opening was closed with two running 0 Vicryl sutures. 0.25% Marcaine was then injected into all port sites and all incisions were reapproximated at the skin level with staples. Sterile dressings were applied. The patient appeared to tolerate the procedure well and without complications. The patient was able to be extubated and transferred to the recovery unit in  satisfactory condition.  [name] MD

## 2012-07-27 NOTE — Interval H&P Note (Signed)
History and Physical Interval Note:  07/27/2012 9:45 AM  Travis Chase  has presented today for surgery, with the diagnosis of PROSTATE CANCER  The various methods of treatment have been discussed with the patient and family. After consideration of risks, benefits and other options for treatment, the patient has consented to  Procedure(s) (LRB) with comments: ROBOTIC ASSISTED LAPAROSCOPIC RADICAL PROSTATECTOMY LEVEL 1 (N/A) as a surgical intervention .  The patient's history has been reviewed, patient examined, no change in status, stable for surgery.  I have reviewed the patient's chart and labs.  Questions were answered to the patient's satisfaction.     Carlus Stay,LES

## 2012-07-27 NOTE — Addendum Note (Signed)
Addendum  created 07/27/12 1420 by Azell Der, MD   Modules edited:Orders

## 2012-07-27 NOTE — Anesthesia Postprocedure Evaluation (Signed)
  Anesthesia Post-op Note  Patient: Travis Chase  Procedure(s) Performed: Procedure(s) (LRB): ROBOTIC ASSISTED LAPAROSCOPIC RADICAL PROSTATECTOMY LEVEL 1 (N/A)  Patient Location: PACU  Anesthesia Type: General  Level of Consciousness: awake and alert   Airway and Oxygen Therapy: Patient Spontanous Breathing  Post-op Pain: mild  Post-op Assessment: Post-op Vital signs reviewed, Patient's Cardiovascular Status Stable, Respiratory Function Stable, Patent Airway and No signs of Nausea or vomiting  Post-op Vital Signs: stable  Complications: No apparent anesthesia complications

## 2012-07-27 NOTE — Transfer of Care (Signed)
Immediate Anesthesia Transfer of Care Note  Patient: Travis Chase  Procedure(s) Performed: Procedure(s) (LRB) with comments: ROBOTIC ASSISTED LAPAROSCOPIC RADICAL PROSTATECTOMY LEVEL 1 (N/A)  Patient Location: PACU  Anesthesia Type: General  Level of Consciousness: awake, alert , oriented and patient cooperative  Airway & Oxygen Therapy: Patient Spontanous Breathing and Patient connected to face mask oxygen  Post-op Assessment: Report given to PACU RN, Post -op Vital signs reviewed and stable and Patient moving all extremities  Post vital signs: Reviewed and stable  Complications: No apparent anesthesia complications

## 2012-07-27 NOTE — Progress Notes (Signed)
Patient ID: Travis Chase, male   DOB: 1946/03/18, 66 y.o.   MRN: [id number]  Post-op note  Subjective: The patient is doing well.  No complaints.  Objective: Vital signs in last 24 hours: Temp:  [96.5 F (35.8 C)-97.9 F (36.6 C)] 97.6 F (36.4 C) (09/16 1454) Pulse Rate:  [58-86] 67  (09/16 1454) Resp:  [11-18] 12  (09/16 1430) BP: (135-173)/(74-102) 135/74 mmHg (09/16 1454) SpO2:  [99 %-100 %] 100 % (09/16 1454)  Intake/Output from previous day:   Intake/Output this shift: Total I/O In: 3000 [I.V.:2000; IV Piggyback:1000] Out: 315 [Urine:95; Drains:70; Blood:150]  Physical Exam:  General: Alert and oriented. Abdomen: Soft, Nondistended.  Lab Results:  Basename 07/27/12 1302  HGB 14.3  HCT 41.5    Assessment/Plan: POD#0   1) Continue to monitor   [name]. MD   LOS: 0 days   [name],LES 07/27/2012, 4:42 PM

## 2012-07-28 ENCOUNTER — Encounter (HOSPITAL_COMMUNITY): Payer: Self-pay | Admitting: Urology

## 2012-07-28 MED ORDER — HYDROCODONE-ACETAMINOPHEN 5-325 MG PO TABS
1.0000 | ORAL_TABLET | Freq: Four times a day (QID) | ORAL | Status: DC | PRN
  Administered 2012-07-28: 2 via ORAL
  Filled 2012-07-28: qty 2

## 2012-07-28 MED ORDER — BISACODYL 10 MG RE SUPP
10.0000 mg | Freq: Once | RECTAL | Status: AC
  Administered 2012-07-28: 10 mg via RECTAL
  Filled 2012-07-28: qty 1

## 2012-07-28 MED ORDER — CIPROFLOXACIN HCL 500 MG PO TABS: 500.0000 mg | ORAL_TABLET | Freq: Two times a day (BID) | ORAL | Status: DC

## 2012-07-28 MED ORDER — HYDROCODONE-ACETAMINOPHEN 5-325 MG PO TABS: 1.0000 | ORAL_TABLET | Freq: Four times a day (QID) | ORAL | Status: DC | PRN

## 2012-07-28 NOTE — Progress Notes (Signed)
Patient ID: Travis Chase, male   DOB: 09/08/1946, 66 y.o.   MRN: [id number]  1 Day Post-Op Subjective: The patient is doing well.  No nausea or vomiting. Pain is adequately controlled.  Objective: Vital signs in last 24 hours: Temp:  [96.5 F (35.8 C)-99.5 F (37.5 C)] 98.8 F (37.1 C) (09/17 0626) Pulse Rate:  [58-86] 71  (09/17 0626) Resp:  [11-18] 16  (09/17 0626) BP: (124-173)/(72-102) 131/74 mmHg (09/17 0626) SpO2:  [97 %-100 %] 97 % (09/17 0626) Weight:  [97.07 kg (214 lb)] 97.07 kg (214 lb) (09/16 1542)  Intake/Output from previous day: 09/16 0701 - 09/17 0700 In: 5372.5 [I.V.:4372.5; IV Piggyback:1000] Out: 1195 [Urine:885; Drains:160; Blood:150] Intake/Output this shift: Total I/O In: 1772.5 [I.V.:1772.5] Out: 840 [Urine:790; Drains:50]  Physical Exam:  General: Alert and oriented. CV: RRR Lungs: Clear bilaterally. GI: Soft, Nondistended. Incisions: Dressings intact. Urine: Clear Extremities: Nontender, no erythema, no edema.  Lab Results:  Basename 07/28/12 0355 07/27/12 1302  HGB 12.5* 14.3  HCT 37.0* 41.5      Assessment/Plan: POD# 1 s/p robotic prostatectomy.  1) SL IVF 2) Ambulate, Incentive spirometry 3) Transition to oral pain medication 4) Dulcolax suppository 5) D/C pelvic drain 6) Recheck Hgb 7) Plan for likely discharge later today   [name]. MD   LOS: 1 day   Alvon Nygaard,LES 07/28/2012, 6:58 AM

## 2012-07-28 NOTE — Discharge Summary (Signed)
  Date of admission: 07/27/2012  Date of discharge: 07/28/2012  Admission diagnosis: Prostate Cancer  Discharge diagnosis: Prostate Cancer  History and Physical: For full details, please see admission history and physical. Briefly, Travis Chase is a 66 y.o. gentleman with localized prostate cancer.  After discussing management/treatment options, he elected to proceed with surgical treatment.  Hospital Course: Travis Chase was taken to the operating room on 07/27/2012 and underwent a robotic assisted laparoscopic radical prostatectomy. He tolerated this procedure well and without complications. Postoperatively, he was able to be transferred to a regular hospital room following recovery from anesthesia.  He was able to begin ambulating the night of surgery. He remained hemodynamically stable overnight.  He had excellent urine output with appropriately minimal output from his pelvic drain and his pelvic drain was removed on POD #1.  He was transitioned to oral pain medication, tolerated a clear liquid diet, and had met all discharge criteria and was able to be discharged home later on POD#1.  Laboratory values:  Basename 07/28/12 1213 07/28/12 0355 07/27/12 1302  HGB 12.7* 12.5* 14.3  HCT 36.6* 37.0* 41.5    Disposition: Home  Discharge instruction: He was instructed to be ambulatory but to refrain from heavy lifting, strenuous activity, or driving. He was instructed on urethral catheter care.  Discharge medications:     Medication List     As of 07/28/2012  1:12 PM    START taking these medications         ciprofloxacin 500 MG tablet   Commonly known as: CIPRO   Take 1 tablet (500 mg total) by mouth 2 (two) times daily. Start day prior to office visit for foley removal      HYDROcodone-acetaminophen 5-325 MG per tablet   Commonly known as: NORCO/VICODIN   Take 1-2 tablets by mouth every 6 (six) hours as needed (pain).      STOP taking these medications         aspirin 81 MG  tablet      doxazosin 2 MG tablet   Commonly known as: CARDURA      fish oil-omega-3 fatty acids 1000 MG capsule      ibuprofen 200 MG tablet   Commonly known as: ADVIL,MOTRIN      naproxen sodium 220 MG tablet   Commonly known as: ANAPROX          Where to get your medications    These are the prescriptions that you need to pick up.   You may get these medications from any pharmacy.         ciprofloxacin 500 MG tablet   HYDROcodone-acetaminophen 5-325 MG per tablet            Followup: He will followup in 1 week for catheter removal and to discuss his surgical pathology results.

## 2012-07-28 NOTE — Progress Notes (Signed)
   CARE MANAGEMENT NOTE 07/28/2012  Patient:  Travis Chase,Travis Chase   Account Number:  [id number]  Date Initiated:  07/28/2012  Documentation initiated by:  Jiles Crocker  Subjective/Objective Assessment:   ADMITTED FOR SURGERY - Robotic assisted laparoscopic radical prostatectomy (bilateral nerve sparing)     Action/Plan:   PCP IS Dr. Mila Palmer  LIVES AT HOME WITH SPOUSE   Anticipated DC Date:  08/04/2012   Anticipated DC Plan:  HOME/SELF CARE      DC Planning Services  CM consult               Status of service:  In process, will continue to follow Medicare Important Message given?  NA - LOS <3 / Initial given by admissions (If response is "NO", the following Medicare IM given date fields will be blank)  Per UR Regulation:  Reviewed for med. necessity/level of care/duration of stay  Comments:  07/28/2012- B Ulice Follett RN, BSN, MHA

## 2015-02-02 ENCOUNTER — Observation Stay (HOSPITAL_COMMUNITY)
Admission: EM | Admit: 2015-02-02 | Discharge: 2015-02-03 | Disposition: A | Discharge status: Home / Self Care | Payer: Medicare Other | Attending: Cardiology | Admitting: Cardiology

## 2015-02-02 ENCOUNTER — Encounter (HOSPITAL_COMMUNITY): Payer: Self-pay | Admitting: *Deleted

## 2015-02-02 ENCOUNTER — Emergency Department (HOSPITAL_COMMUNITY): Payer: Medicare Other

## 2015-02-02 DIAGNOSIS — N400 Enlarged prostate without lower urinary tract symptoms: Secondary | ICD-10-CM | POA: Diagnosis not present

## 2015-02-02 DIAGNOSIS — Z87891 Personal history of nicotine dependence: Secondary | ICD-10-CM | POA: Insufficient documentation

## 2015-02-02 DIAGNOSIS — M1990 Unspecified osteoarthritis, unspecified site: Secondary | ICD-10-CM | POA: Diagnosis not present

## 2015-02-02 DIAGNOSIS — Z8719 Personal history of other diseases of the digestive system: Secondary | ICD-10-CM | POA: Insufficient documentation

## 2015-02-02 DIAGNOSIS — E785 Hyperlipidemia, unspecified: Secondary | ICD-10-CM | POA: Diagnosis not present

## 2015-02-02 DIAGNOSIS — I10 Essential (primary) hypertension: Secondary | ICD-10-CM | POA: Diagnosis not present

## 2015-02-02 DIAGNOSIS — Z7289 Other problems related to lifestyle: Secondary | ICD-10-CM | POA: Diagnosis present

## 2015-02-02 DIAGNOSIS — F1090 Alcohol use, unspecified, uncomplicated: Secondary | ICD-10-CM | POA: Diagnosis present

## 2015-02-02 DIAGNOSIS — E669 Obesity, unspecified: Secondary | ICD-10-CM | POA: Diagnosis present

## 2015-02-02 DIAGNOSIS — I4892 Unspecified atrial flutter: Principal | ICD-10-CM | POA: Diagnosis present

## 2015-02-02 HISTORY — DX: Hyperlipidemia, unspecified: E78.5

## 2015-02-02 HISTORY — DX: Alcohol use, unspecified, uncomplicated: F10.90

## 2015-02-02 HISTORY — DX: Benign prostatic hyperplasia without lower urinary tract symptoms: N40.0

## 2015-02-02 HISTORY — DX: Obesity, unspecified: E66.9

## 2015-02-02 HISTORY — DX: Other problems related to lifestyle: Z72.89

## 2015-02-02 HISTORY — DX: Unspecified atrial flutter: I48.92

## 2015-02-02 LAB — TSH: TSH: 1.936 u[IU]/mL (ref 0.350–4.500)

## 2015-02-02 LAB — CBC WITH DIFFERENTIAL/PLATELET
BASOS PCT: 1 % (ref 0–1)
Basophils Absolute: 0 10*3/uL (ref 0.0–0.1)
Eosinophils Absolute: 0.1 10*3/uL (ref 0.0–0.7)
Eosinophils Relative: 1 % (ref 0–5)
HEMATOCRIT: 45.4 % (ref 39.0–52.0)
HEMOGLOBIN: 15.8 g/dL (ref 13.0–17.0)
LYMPHS ABS: 1.5 10*3/uL (ref 0.7–4.0)
LYMPHS PCT: 22 % (ref 12–46)
MCH: 30.7 pg (ref 26.0–34.0)
MCHC: 34.8 g/dL (ref 30.0–36.0)
MCV: 88.3 fL (ref 78.0–100.0)
Monocytes Absolute: 0.5 10*3/uL (ref 0.1–1.0)
Monocytes Relative: 7 % (ref 3–12)
NEUTROS ABS: 4.9 10*3/uL (ref 1.7–7.7)
Neutrophils Relative %: 69 % (ref 43–77)
PLATELETS: 211 10*3/uL (ref 150–400)
RBC: 5.14 MIL/uL (ref 4.22–5.81)
RDW: 12 % (ref 11.5–15.5)
WBC: 6.9 10*3/uL (ref 4.0–10.5)

## 2015-02-02 LAB — BASIC METABOLIC PANEL
ANION GAP: 8 (ref 5–15)
BUN: 12 mg/dL (ref 6–23)
CO2: 24 mmol/L (ref 19–32)
Calcium: 9.3 mg/dL (ref 8.4–10.5)
Chloride: 106 mmol/L (ref 96–112)
Creatinine, Ser: 0.87 mg/dL (ref 0.50–1.35)
GFR calc non Af Amer: 85 mL/min — ABNORMAL LOW (ref >90)
Glucose, Bld: 122 mg/dL — ABNORMAL HIGH (ref 70–99)
Potassium: 4.1 mmol/L (ref 3.5–5.1)
Sodium: 138 mmol/L (ref 135–145)

## 2015-02-02 LAB — TROPONIN I

## 2015-02-02 LAB — BRAIN NATRIURETIC PEPTIDE: B Natriuretic Peptide: 76 pg/mL (ref 0.0–100.0)

## 2015-02-02 MED ORDER — DILTIAZEM HCL 100 MG IV SOLR
5.0000 mg/h | Freq: Once | INTRAVENOUS | Status: AC
  Administered 2015-02-02: 10 mg/h via INTRAVENOUS
  Administered 2015-02-02: 5 mg/h via INTRAVENOUS

## 2015-02-02 MED ORDER — METOPROLOL TARTRATE 25 MG PO TABS: 25.0000 mg | ORAL_TABLET | Freq: Two times a day (BID) | ORAL | Status: DC

## 2015-02-02 MED ORDER — ONDANSETRON HCL 4 MG/2ML IJ SOLN: 4.0000 mg | Freq: Four times a day (QID) | INTRAMUSCULAR | Status: DC | PRN

## 2015-02-02 MED ORDER — ACETAMINOPHEN 325 MG PO TABS
650.0000 mg | ORAL_TABLET | ORAL | Status: DC | PRN
  Administered 2015-02-02: 650 mg via ORAL
  Filled 2015-02-02: qty 2

## 2015-02-02 MED ORDER — RIVAROXABAN 20 MG PO TABS
20.0000 mg | ORAL_TABLET | Freq: Every day | ORAL | Status: DC
  Administered 2015-02-02: 20 mg via ORAL
  Filled 2015-02-02: qty 1

## 2015-02-02 MED ORDER — METOPROLOL TARTRATE 25 MG PO TABS
25.0000 mg | ORAL_TABLET | Freq: Two times a day (BID) | ORAL | Status: DC
  Administered 2015-02-02 – 2015-02-03 (×3): 25 mg via ORAL
  Filled 2015-02-02 (×4): qty 1

## 2015-02-02 MED ORDER — DILTIAZEM HCL 25 MG/5ML IV SOLN
10.0000 mg | Freq: Once | INTRAVENOUS | Status: AC
  Administered 2015-02-02: 10 mg via INTRAVENOUS

## 2015-02-02 MED ORDER — RIVAROXABAN 20 MG PO TABS
20.0000 mg | ORAL_TABLET | Freq: Every day | ORAL | Status: DC
  Filled 2015-02-02 (×2): qty 1

## 2015-02-02 NOTE — ED Notes (Signed)
Attempted report to 3 W. 

## 2015-02-02 NOTE — ED Notes (Signed)
PT presents in a flutter with rvr. Triage input delayed rt IV/medication start.
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
## 2015-02-02 NOTE — Progress Notes (Signed)
Spoke with Dr. Philbert Riser about the fact that pt has no current orders for cardizem drip. Per MD note, cardizem to be stopped and metoprolol 25 mg bid to be started. Pt had received one dose of metoprolol at 1843. Per Dr. Philbert Riser, give scheduled dose of 25 mg of metoprolol at 2200 and stop cardizem drip at this time since pt's HR is rate controlled in the 70s. Will continue to monitor pt.

## 2015-02-02 NOTE — ED Notes (Signed)
Cardiology at bedside.
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
## 2015-02-02 NOTE — ED Notes (Signed)
Cardiology consult completed.

## 2015-02-02 NOTE — ED Notes (Signed)
X RAY at bedside 

## 2015-02-02 NOTE — ED Notes (Signed)
Pt brought back to room via wheelchair; pt undressed, in gown, on monitor, continuous pulse oximetry and blood pressure cuff

## 2015-02-02 NOTE — ED Notes (Signed)
Pt arrives from [place] at [place] via [place]. Pt had a routine check up today and was in a flutter with rvr. Pt is asymptomatic at this time.

## 2015-02-03 ENCOUNTER — Other Ambulatory Visit: Payer: Self-pay | Admitting: Physician Assistant

## 2015-02-03 ENCOUNTER — Encounter (HOSPITAL_COMMUNITY): Payer: Self-pay | Admitting: Physician Assistant

## 2015-02-03 DIAGNOSIS — I10 Essential (primary) hypertension: Secondary | ICD-10-CM | POA: Diagnosis present

## 2015-02-03 DIAGNOSIS — I4892 Unspecified atrial flutter: Secondary | ICD-10-CM

## 2015-02-03 DIAGNOSIS — F1010 Alcohol abuse, uncomplicated: Secondary | ICD-10-CM

## 2015-02-03 DIAGNOSIS — Z7289 Other problems related to lifestyle: Secondary | ICD-10-CM | POA: Diagnosis present

## 2015-02-03 DIAGNOSIS — E669 Obesity, unspecified: Secondary | ICD-10-CM | POA: Diagnosis present

## 2015-02-03 DIAGNOSIS — E785 Hyperlipidemia, unspecified: Secondary | ICD-10-CM | POA: Diagnosis present

## 2015-02-03 DIAGNOSIS — M1990 Unspecified osteoarthritis, unspecified site: Secondary | ICD-10-CM | POA: Diagnosis not present

## 2015-02-03 DIAGNOSIS — R0683 Snoring: Secondary | ICD-10-CM

## 2015-02-03 DIAGNOSIS — N400 Enlarged prostate without lower urinary tract symptoms: Secondary | ICD-10-CM | POA: Diagnosis not present

## 2015-02-03 DIAGNOSIS — F1090 Alcohol use, unspecified, uncomplicated: Secondary | ICD-10-CM | POA: Diagnosis present

## 2015-02-03 LAB — LIPID PANEL
CHOLESTEROL: 185 mg/dL (ref 0–200)
HDL: 51 mg/dL (ref >39)
LDL CALC: 108 mg/dL — AB (ref 0–99)
TRIGLYCERIDES: 129 mg/dL (ref <150)
Total CHOL/HDL Ratio: 3.6 RATIO
VLDL: 26 mg/dL (ref 0–40)

## 2015-02-03 LAB — HEPATIC FUNCTION PANEL
ALT: 37 U/L (ref 0–53)
AST: 26 U/L (ref 0–37)
Albumin: 3.6 g/dL (ref 3.5–5.2)
Alkaline Phosphatase: 59 U/L (ref 39–117)
BILIRUBIN TOTAL: 0.6 mg/dL (ref 0.3–1.2)
Bilirubin, Direct: 0.2 mg/dL (ref 0.0–0.5)
Indirect Bilirubin: 0.4 mg/dL (ref 0.3–0.9)
Total Protein: 6.6 g/dL (ref 6.0–8.3)

## 2015-02-03 LAB — CBC
HEMATOCRIT: 44.6 % (ref 39.0–52.0)
Hemoglobin: 15.1 g/dL (ref 13.0–17.0)
MCH: 30 pg (ref 26.0–34.0)
MCHC: 33.9 g/dL (ref 30.0–36.0)
MCV: 88.7 fL (ref 78.0–100.0)
PLATELETS: 219 10*3/uL (ref 150–400)
RBC: 5.03 MIL/uL (ref 4.22–5.81)
RDW: 12.1 % (ref 11.5–15.5)
WBC: 6.1 10*3/uL (ref 4.0–10.5)

## 2015-02-03 LAB — BASIC METABOLIC PANEL
ANION GAP: 4 — AB (ref 5–15)
BUN: 11 mg/dL (ref 6–23)
CALCIUM: 9 mg/dL (ref 8.4–10.5)
CO2: 28 mmol/L (ref 19–32)
CREATININE: 0.9 mg/dL (ref 0.50–1.35)
Chloride: 107 mmol/L (ref 96–112)
GFR calc non Af Amer: 84 mL/min — ABNORMAL LOW (ref >90)
Glucose, Bld: 104 mg/dL — ABNORMAL HIGH (ref 70–99)
Potassium: 4 mmol/L (ref 3.5–5.1)
Sodium: 139 mmol/L (ref 135–145)

## 2015-02-03 LAB — PROTIME-INR
INR: 1.58 — AB (ref 0.00–1.49)
Prothrombin Time: 19 seconds — ABNORMAL HIGH (ref 11.6–15.2)

## 2015-02-03 LAB — TROPONIN I

## 2015-02-03 MED ORDER — METOPROLOL TARTRATE 25 MG PO TABS: 25.0000 mg | ORAL_TABLET | Freq: Two times a day (BID) | ORAL | Status: DC

## 2015-02-03 MED ORDER — RIVAROXABAN 20 MG PO TABS: 20.0000 mg | ORAL_TABLET | Freq: Every day | ORAL | Status: DC

## 2015-02-03 NOTE — Discharge Summary (Signed)
Discharge Summary   Patient ID: Travis Chase MRN: [id number], DOB/AGE: 71-Jul-1947 71 y.o. Admit date: 02/02/2015 D/C date:     02/03/2015  Primary Care Provider: Lilian Coma, MD Primary Cardiologist: Travis Chase  Primary Discharge Diagnoses:  1. New onset atrial flutter of unclear duration, initially with RVR 2. HTN, controlled 3. HLD - LDL 108 4. Obesity Body mass index is 31.14 kg/(m^2). with snoring 5. Habitual alcohol intake (2-3 or more nightly)  Secondary Discharge Diagnoses:  1. Arthritis 2. Diverticulosis of colon 3. BPH  Hospital Course: Travis Chase is a 69 y/o M with history of HTN, HLD, obesity, and no prior cardiac history who presented to [facility] from his PCP's office yesterday with new onset atrial flutter with RVR. Has hx of HTN, but took himself off his meds about a year ago.The patient was in his usual state of health yesterday and was at his PCP's office for a routine physical. He was incidentally found to be in atrial flutter. He denied chest pain or palpitations but had noted in retrospect he had been having some SOB with exertion over the last month or so when walking up hills or swimming.His wife did tell him that he snores often. He remotely smoked cigarettes on and off for about 10 years and drink 2-3 or more drinks a night. He has a family hx of CAD in his father who died in his early 15s from an MI. However, his father drank and smoke heavily. 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 In the ER he was in 2:1 atrial flutter and was started on IV diltiazem with HR improvement. CHADSVASC score at least 2 thus Xarelto started (HTN; age - A1C is still in process at time of DC). 30 day free card and regular rx provided. IV dilt was transitioned to oral metoprolol which he tolerated well. HR was still 80s-90s this AM but this was before AM dose of metoprolol and SBP 110s so dose was not titrated. EKG was obtained this AM since his flutter waves are  somewhat smooth - the EKG reads out as "NSR" but this was NOT the case as he was still clearly in atrial flutter with variable block on telemetry. Labs notable for normal CBC, BMET, negative troponin x 1, normal BNP, LDL 108. CXR no active disease. Echo (per Travis Chase read) showed normal LV function, somewhat enlarged RV (question due to OSA), non-enlarged LA, no significant valvular disease. He is not SOB, tachypnic, or has any signs of SVT. EtOH reduction/cessation advised. Message sent to Travis Chase patient care coordinator to arrange sleep study to evaluate for sleep apnea. The patient feels well today. Travis Chase has seen and examined the patient today and feels he is stable for discharge. Will have him follow up as outpatient to consider DCCV after 3-4 weeks of anticoagulation if still out of rhythm.  The question was raised whether he would require ischemic evaluation given new atrial flutter and risk factors to include HTN, HLD, family history. We will arrange Lexiscan nuclear stress test as outpatient. This was chosen because in order to do an ETT-nuc, we would've had to hold his rate controlling medicine and he may go into rapid rate. Of note the patient was advised "Patients taking Xarelto should generally stay away from medicines like ibuprofen, Advil, Motrin, naproxen, and Aleve due to risk of stomach bleeding. You may take Tylenol as directed or talk to your primary doctor about alternatives." He was  taking PRN NSAIDS for arthritis but has no history of PUD or bleeding. Home aspirin was stopped given Xarelto use.  Discharge Vitals: Blood pressure 113/72, pulse 69, temperature 98.5 F (36.9 C), temperature source Oral, resp. rate 18, height 5\' 10"  (1.778 m), weight 217 lb (98.431 kg), SpO2 100 %.  Labs: Lab Results  Component Value Date   WBC 6.1 02/03/2015   HGB 15.1 02/03/2015   HCT 44.6 02/03/2015   MCV 88.7 02/03/2015   PLT 219 02/03/2015    Recent Labs Lab 02/03/15 0436  NA  139  K 4.0  CL 107  CO2 28  BUN 11  CREATININE 0.90  CALCIUM 9.0  GLUCOSE 104*    Recent Labs  02/02/15 1045  TROPONINI <0.03   Lab Results  Component Value Date   CHOL 185 02/03/2015   HDL 51 02/03/2015   LDLCALC 108* 02/03/2015   TRIG 129 02/03/2015   Diagnostic Studies/Procedures   Dg Chest Port 1 View  02/02/2015   CLINICAL DATA:  Atrial flutter  EXAM: PORTABLE CHEST - 1 VIEW  COMPARISON:  07/23/2012  FINDINGS: Normal heart size, with apparent increase from previous related to technical differences. Stable aortic and hilar contours. There is no edema, consolidation, effusion, or pneumothorax.  IMPRESSION: No active disease.   Electronically Signed   By: Travis Chase M.D.   On: 02/02/2015 11:42   Full echo report pending but preliminary read by Travis Chase described above  Discharge Medications   Current Discharge Medication List    START taking these medications   Details  metoprolol tartrate (LOPRESSOR) 25 MG tablet Take 1 tablet (25 mg total) by mouth 2 (two) times daily. Qty: 60 tablet, Refills: 6    rivaroxaban (XARELTO) 20 MG TABS tablet Take 1 tablet (20 mg total) by mouth daily with supper. Qty: 30 tablet, Refills: 6      STOP taking these medications     aspirin 81 MG tablet      ibuprofen (ADVIL,MOTRIN) 200 MG tablet      naproxen sodium (ANAPROX) 220 MG tablet         Disposition   The patient will be discharged in stable condition to home. Discharge Instructions    Diet - low sodium heart healthy    Complete by:  As directed      Increase activity slowly    Complete by:  As directed   Patients taking Xarelto should generally stay away from medicines like ibuprofen, Advil, Motrin, naproxen, and Aleve due to risk of stomach bleeding. You may take Tylenol as directed or talk to your primary doctor about alternatives.  Stop aspirin.          Follow-up Information    Follow up with [facility].   Specialty:   Cardiology   Why:  Stress test 02/14/15 at 7:45am   Contact information:   [address] [place] [phone]      Follow up with Travis Dopp, PA-C.   Specialty:  Physician Assistant   Why:  [facility] - 02/27/15 at 11:10am   Contact information:   [address] [address] [phone]       Follow up with [facility].   Specialty:  Cardiology   Why:  Office will call you to arrange sleep study.   Contact information:   [address] [place]  Duration of Discharge Encounter: Greater than 30 minutes including physician and PA time.  Signed, Melina Copa PA-C 02/03/2015, 11:49 AM

## 2015-02-03 NOTE — Progress Notes (Signed)
Patient has met adequate criteria for discharge per MD order. Patient discharge summary was printed and given to patient. All required education, follow-up appointments, new medications and when to notify provider was gone over with patient. All hospital equipment and invasive lines were removed. Patient left the hospital escorted by floor medical tech with patient belongings & prescriptions in hand.

## 2015-02-03 NOTE — Progress Notes (Signed)
Patient: Travis Chase / Admit Date: 02/02/2015 / Date of Encounter: 02/03/2015, 10:21 AM   Subjective: He was "blindsided" by this diagnosis at PCP yesterday - was really surprised to hear he had an arrhythmia. No chest pain or SOB. Feeling great.   Objective: Telemetry: atrial flutter with variable ventricular response, currently 80s-90s (steady 60s-70s overnight - almost looked NSR but upon closer inspection appears 3:1 flutter) Physical Exam: Blood pressure 113/72, pulse 69, temperature 98.5 F (36.9 C), temperature source Oral, resp. rate 18, height 5\' 10"  (1.778 m), weight 217 lb (98.431 kg), SpO2 100 %. General: Well developed, well nourished WM, in no acute distress. Head: Normocephalic, atraumatic, sclera non-icteric, no xanthomas, nares are without discharge. Neck: JVP not elevated. Lungs: Clear bilaterally to auscultation without wheezes, rales, or rhonchi. Breathing is unlabored. Heart: Irregularly irregular S1 S2 without murmurs, rubs, or gallops.  Abdomen: Soft, non-tender, non-distended with normoactive bowel sounds. No rebound/guarding. Extremities: No clubbing or cyanosis. No edema. Distal pedal pulses are 2+ and equal bilaterally. Neuro: Alert and oriented X 3. Moves all extremities spontaneously. Psych:  Responds to questions appropriately with a normal affect.   Intake/Output Summary (Last 24 hours) at 02/03/15 1021 Last data filed at 02/02/15 2000  Gross per 24 hour  Intake    240 ml  Output      0 ml  Net    240 ml    Inpatient Medications:  . metoprolol tartrate  25 mg Oral BID  . rivaroxaban  20 mg Oral Q supper   Infusions:    Labs:  Recent Labs  02/02/15 1045 02/03/15 0436  NA 138 139  K 4.1 4.0  CL 106 107  CO2 24 28  GLUCOSE 122* 104*  BUN 12 11  CREATININE 0.87 0.90  CALCIUM 9.3 9.0   No results for input(s): AST, ALT, ALKPHOS, BILITOT, PROT, ALBUMIN in the last 72 hours.  Recent Labs  02/02/15 1045 02/03/15 0436  WBC 6.9 6.1   NEUTROABS 4.9  --   HGB 15.8 15.1  HCT 45.4 44.6  MCV 88.3 88.7  PLT 211 219    Recent Labs  02/02/15 1045  TROPONINI <0.03   Invalid input(s): POCBNP No results for input(s): HGBA1C in the last 72 hours.   Radiology/Studies:  Dg Chest Port 1 View  02/02/2015   CLINICAL DATA:  Atrial flutter  EXAM: PORTABLE CHEST - 1 VIEW  COMPARISON:  07/23/2012  FINDINGS: Normal heart size, with apparent increase from previous related to technical differences. Stable aortic and hilar contours. There is no edema, consolidation, effusion, or pneumothorax.  IMPRESSION: No active disease.   Electronically Signed   By: Monte Fantasia M.D.   On: 02/02/2015 11:42     Assessment and Plan  1. New onset atrial flutter of unclear duration - CHADSVASC at least 2 (HTN, age) thus Xarelto 20mg  started - TSH normal - discussed cutting down/cessation of alcohol - will need outpatient sleep study - will discuss ischemic eval with MD (risk factors of HTN, HLD, father died of CAD in 51s) - suspect this will depend on echocardiogram results. No recent CP but has had some exertional dyspnea (although may be r/t flutter) - rates improved on Lopressor, currently 90s. May need eventual titration but not sure BP will tolerate much higher dose - ordered EKG this AM to have better look at atrial waveform since initial EKG was 2:1 and this almost looks like coarse AF by tele - anticipate dc today after echo result with follow-up  to plan DCCV in 4 weeks if still out of rhythm  2. HTN, controlled 3. HLD - LDL 108 4. Obesity Body mass index is 31.14 kg/(m^2). with snoring 5. Habitual alcohol intake (2-3 or more nightly)  Signed, Dayna Dunn PA-C  I have seen and examined the patient along with Melina Copa PA-C.  I have reviewed the chart, notes and new data.  I agree with PA's note.  Key new complaints: feels great Key examination changes: persistent atrial flutter with controlled rate Key new findings / data: echo (on  quick preliminary review) shows normal LV size and function, small LA, mildly dilated RV with normal function  Constellation of heavy snoring, obesity and mild RV enlargement is consistent with obstructive sleep apnea as possible cause of atrial flutter.  PLAN:  He is virtually asymptomatic, well rate controlled. DC home with outpatient sleep study, Lexiscan Myoview and plan for elective cardioversion after a minimum of 3 weeks of uninterrupted anticoagulation  Sanda Klein, MD, [facility] and [place] [phone] 02/03/2015, 11:39 AM

## 2015-02-03 NOTE — Progress Notes (Signed)
UR completed 

## 2015-02-03 NOTE — Discharge Instructions (Signed)

## 2015-02-03 NOTE — Progress Notes (Signed)
  Echocardiogram 2D Echocardiogram has been performed.  Travis Chase 02/03/2015, 9:23 AM

## 2015-02-03 NOTE — Care Management Note (Signed)
    Page 1 of 1   02/03/2015     10:57:41 AM CARE MANAGEMENT NOTE 02/03/2015  Patient:  Travis Chase, Travis Chase   Account Number:  [id number]  Date Initiated:  02/03/2015  Documentation initiated by:  GRAVES-BIGELOW,Taydem Cavagnaro  Subjective/Objective Assessment:   Pt admitted for new onset atrial flutter with RVR.                          Action/Plan:   Plan for home on xarelto. Benefits check in process and will make pt aware once completed. 30 day free card given to pt. Pt will need Rx to go along with 30 day card.   Anticipated DC Date:  02/04/2015   Anticipated DC Plan:  HOME/SELF CARE      DC Planning Services  CM consult  Medication Assistance      Choice offered to / List presented to:             Status of service:  Completed, signed off Medicare Important Message given?  NO (If response is "NO", the following Medicare IM given date fields will be blank) Date Medicare IM given:   Medicare IM given by:   Date Additional Medicare IM given:   Additional Medicare IM given by:    Discharge Disposition:  HOME/SELF CARE  Per UR Regulation:  Reviewed for med. necessity/level of care/duration of stay  If discussed at Welcome of Stay Meetings, dates discussed:    Comments:  1026 02-03-15 Jacqlyn Krauss, RN,BSN [phone] Unable to get co pay amount due to [facility] is closed due to [place]. Pt uses [facility] in [place] and medication is avaialbe. [facility] provided test claim and cost will be 40.00. No further needs from CM at this time.

## 2015-02-04 LAB — HEMOGLOBIN A1C
Hgb A1c MFr Bld: 6.2 % — ABNORMAL HIGH (ref 4.8–5.6)
Mean Plasma Glucose: 131 mg/dL

## 2015-02-14 ENCOUNTER — Ambulatory Visit (HOSPITAL_COMMUNITY): Payer: Medicare Other | Attending: Cardiovascular Disease | Admitting: Radiology

## 2015-02-14 DIAGNOSIS — I4892 Unspecified atrial flutter: Secondary | ICD-10-CM | POA: Insufficient documentation

## 2015-02-14 MED ORDER — TECHNETIUM TC 99M SESTAMIBI GENERIC - CARDIOLITE
33.0000 | Freq: Once | INTRAVENOUS | Status: AC | PRN
  Administered 2015-02-14: 33 via INTRAVENOUS

## 2015-02-14 MED ORDER — REGADENOSON 0.4 MG/5ML IV SOLN
0.4000 mg | Freq: Once | INTRAVENOUS | Status: AC
  Administered 2015-02-14: 0.4 mg via INTRAVENOUS

## 2015-02-14 MED ORDER — TECHNETIUM TC 99M SESTAMIBI GENERIC - CARDIOLITE
11.0000 | Freq: Once | INTRAVENOUS | Status: AC | PRN
  Administered 2015-02-14: 11 via INTRAVENOUS

## 2015-02-14 NOTE — Progress Notes (Signed)
[facility] [address] [place], [place] [address] [phone]    Cardiology Nuclear Med Study  Travis Chase is a 69 y.o. male     MRN : [id number]     DOB: [birth year]  Procedure Date: 02/14/2015  Nuclear Med Background Indication for Stress Test:  Evaluation for Ischemia, Surgical Clearance:Pending DCCV, 02/02/15 Post Hospital:New onset of Atrial Flutter and Abnormal EKG History:  Atrial Flutter Cardiac Risk Factors: Hypertension and Lipids  Symptoms:  DOE   Nuclear Pre-Procedure Caffeine/Decaff Intake:  None NPO After: 7:00pm   Lungs:  clear O2 Sat: 98% on room air. IV 0.9% NS with Angio Cath:  22g  IV Site: R Hand  IV Started by:  Matilde Haymaker, RN  Chest Size (in):  48 Cup Size: n/a  Height: 5\' 10"  (1.778 m)  Weight:  213 lb (96.616 kg)  BMI:  Body mass index is 30.56 kg/(m^2). Tech Comments:  No Metoprolol x 12 hrs    Nuclear Med Study 1 or 2 day study: 1 day  Stress Test Type:  Lexiscan  Reading MD: n/a  Order Authorizing Provider:  Chelsea Primus and Mihai Croitoru,MD  Resting Radionuclide: Technetium 94m Sestamibi  Resting Radionuclide Dose: 1.07 mCi   Stress Radionuclide:  Technetium 50m Sestamibi  Stress Radionuclide Dose: 0.34 mCi           Stress Protocol Rest HR: 134 Stress HR: 137  Rest BP: 124/103 Stress BP: 130/97  Exercise Time (min): n/a METS: n/a   Predicted Max HR: 150 bpm % Max HR: 91.33 bpm Rate Pressure Product: 17810   Dose of Adenosine (mg):  n/a Dose of Lexiscan: 0.4 mg  Dose of Atropine (mg): n/a Dose of Dobutamine: n/a mcg/kg/min (at max HR)  Stress Test Technologist: Glade Lloyd, BS-ES  Nuclear Technologist:  Earl Many, CNMT     Rest Procedure:  Myocardial perfusion imaging was performed at rest 45 minutes following the intravenous administration of Technetium 74m Sestamibi. Rest ECG: Atrial flutter with RVR at 134bpm  Stress Procedure:  The patient received IV Lexiscan 0.4 mg over  15-seconds.  Technetium 36m Sestamibi injected at 30-seconds.  Quantitative spect images were obtained after a 45 minute delay.  Discussed rhythm and heart rate with Dr. Marlou Porch prior to beginning the test.  He recommended continuing with the [facility].  During the infusion the patient did not have any symptoms.  Stress ECG: No significant change from baseline ECG  QPS Raw Data Images:  Mild diaphragmatic attenuation.  Normal left ventricular size. Stress Images:  Normal homogeneous uptake in all areas of the myocardium. Rest Images:  Normal homogeneous uptake in all areas of the myocardium. Subtraction (SDS):  No evidence of ischemia. Transient Ischemic Dilatation (Normal <1.22):  1.17 Lung/Heart Ratio (Normal <0.45):  0.34  Quantitative Gated Spect Images QGS EDV:  79 ml QGS ESV:  53 ml  Impression Exercise Capacity:  Lexiscan with no exercise. BP Response:  Normal blood pressure response. Clinical Symptoms:  No significant symptoms noted. ECG Impression:  No significant ST segment change suggestive of ischemia. Comparison with Prior Nuclear Study: No previous nuclear study performed  Overall Impression:  Intermediate risk nuclear stress test due to reduced LVF.  There is normal myocardial perfusion in all regions on rest and stress imaging.   LV Ejection Fraction: 32%.  LV Wall Motion:  Moderately reduced LV Function; Moderate diffuse hypokinesis   Signed: Fransico Him, MD [facility] 02/14/2015

## 2015-02-15 ENCOUNTER — Other Ambulatory Visit: Payer: Self-pay | Admitting: *Deleted

## 2015-02-15 ENCOUNTER — Telehealth: Payer: Self-pay | Admitting: Nurse Practitioner

## 2015-02-15 DIAGNOSIS — I4891 Unspecified atrial fibrillation: Secondary | ICD-10-CM

## 2015-02-15 NOTE — Telephone Encounter (Signed)
Called patient to review myoview results with patient and Dr. Theodosia Blender and Trinidad Curet advice to repeat limited echo to assess LV function.  Order in epic.  Patient verbalized understanding and agreement and has appointment scheduled for 4/13.

## 2015-02-16 ENCOUNTER — Other Ambulatory Visit: Payer: Self-pay | Admitting: Physician Assistant

## 2015-02-22 ENCOUNTER — Ambulatory Visit (HOSPITAL_COMMUNITY): Payer: Medicare Other | Attending: Cardiology | Admitting: Radiology

## 2015-02-22 DIAGNOSIS — I4891 Unspecified atrial fibrillation: Secondary | ICD-10-CM

## 2015-02-22 NOTE — Progress Notes (Signed)
Limited Echocardiogram performed for LV Function.

## 2015-02-24 ENCOUNTER — Telehealth: Payer: Self-pay | Admitting: Cardiology

## 2015-02-24 DIAGNOSIS — R931 Abnormal findings on diagnostic imaging of heart and coronary circulation: Secondary | ICD-10-CM

## 2015-02-24 NOTE — Telephone Encounter (Signed)
New problem   Pt stated Dr Radford Pax called him with [place] results. Please call pt.

## 2015-02-24 NOTE — Telephone Encounter (Signed)
Informed patient of results and verbal understanding expressed.  Cardiac MRI ordered for scheduling Patient agrees with treatment plan. 

## 2015-02-24 NOTE — Telephone Encounter (Signed)
-----   Message from Sueanne Margarita, MD sent at 02/22/2015  4:17 PM EDT ----- Please let patient know that echo showed mildly to moderately reduced LVF which is different from echo in March  I would like him to have a Cardic MRI with morphology to assess EF further

## 2015-02-27 ENCOUNTER — Encounter: Payer: Self-pay | Admitting: *Deleted

## 2015-02-27 ENCOUNTER — Ambulatory Visit (INDEPENDENT_AMBULATORY_CARE_PROVIDER_SITE_OTHER): Payer: Medicare Other | Admitting: Physician Assistant

## 2015-02-27 ENCOUNTER — Encounter: Payer: Self-pay | Admitting: Physician Assistant

## 2015-02-27 ENCOUNTER — Encounter: Payer: Self-pay | Admitting: Cardiology

## 2015-02-27 VITALS — BP 150/108 | HR 139 | Ht 70.0 in | Wt 219.8 lb

## 2015-02-27 DIAGNOSIS — I4892 Unspecified atrial flutter: Secondary | ICD-10-CM

## 2015-02-27 DIAGNOSIS — I10 Essential (primary) hypertension: Secondary | ICD-10-CM

## 2015-02-27 DIAGNOSIS — I420 Dilated cardiomyopathy: Secondary | ICD-10-CM | POA: Diagnosis not present

## 2015-02-27 LAB — CBC WITH DIFFERENTIAL/PLATELET
BASOS ABS: 0 10*3/uL (ref 0.0–0.1)
Basophils Relative: 0.7 % (ref 0.0–3.0)
EOS PCT: 1.8 % (ref 0.0–5.0)
Eosinophils Absolute: 0.1 10*3/uL (ref 0.0–0.7)
HCT: 43.8 % (ref 39.0–52.0)
Hemoglobin: 15.1 g/dL (ref 13.0–17.0)
LYMPHS ABS: 2 10*3/uL (ref 0.7–4.0)
LYMPHS PCT: 32 % (ref 12.0–46.0)
MCHC: 34.6 g/dL (ref 30.0–36.0)
MCV: 87.2 fl (ref 78.0–100.0)
MONOS PCT: 6.7 % (ref 3.0–12.0)
Monocytes Absolute: 0.4 10*3/uL (ref 0.1–1.0)
NEUTROS ABS: 3.8 10*3/uL (ref 1.4–7.7)
Neutrophils Relative %: 58.8 % (ref 43.0–77.0)
Platelets: 242 10*3/uL (ref 150.0–400.0)
RBC: 5.02 Mil/uL (ref 4.22–5.81)
RDW: 11.6 % (ref 11.5–15.5)
WBC: 6.4 10*3/uL (ref 4.0–10.5)

## 2015-02-27 LAB — BASIC METABOLIC PANEL
BUN: 16 mg/dL (ref 6–23)
CALCIUM: 9.6 mg/dL (ref 8.4–10.5)
CO2: 26 meq/L (ref 19–32)
Chloride: 106 mEq/L (ref 96–112)
Creatinine, Ser: 0.84 mg/dL (ref 0.40–1.50)
GFR: 95.85 mL/min (ref >60.00)
Glucose, Bld: 88 mg/dL (ref 70–99)
POTASSIUM: 4.2 meq/L (ref 3.5–5.1)
Sodium: 138 mEq/L (ref 135–145)
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
## 2015-02-27 NOTE — Patient Instructions (Addendum)
Medication Instructions:  1. INCREASE METOPROLOL TO 25 MG 3 TIMES A DAY UNTIL YOUR CARDIOVERSION 2. MORNING OF CARDIOVERSION HOLD METOPROLOL; LATER THAT DAY YOU WILL GO BACK TO THE METOPROLOL TWICE DAILY  Labwork: TODAY, BMET, CBC W/DIFF  Testing/Procedures: Your physician has recommended that you have a Cardioversion (DCCV). Electrical Cardioversion uses a jolt of electricity to your heart either through paddles or wired patches attached to your chest. This is a controlled, usually prescheduled, procedure. Defibrillation is done under light anesthesia in the hospital, and you usually go home the day of the procedure. This is done to get your heart back into a normal rhythm. You are not awake for the procedure. Please see the instruction sheet given to you today.  Follow-Up: 03/16/15 WITH DR. Radford Pax AT 1:45  Any Other Special Instructions Will Be Listed Below (If Applicable).

## 2015-02-28 ENCOUNTER — Telehealth: Payer: Self-pay | Admitting: *Deleted

## 2015-02-28 NOTE — Telephone Encounter (Signed)
lmptcb to go over lab results 

## 2015-03-01 ENCOUNTER — Ambulatory Visit (HOSPITAL_COMMUNITY): Payer: Medicare Other | Admitting: Anesthesiology

## 2015-03-01 ENCOUNTER — Ambulatory Visit (HOSPITAL_COMMUNITY)
Admission: RE | Admit: 2015-03-01 | Discharge: 2015-03-01 | Disposition: A | Discharge status: Home / Self Care | Payer: Medicare Other | Source: Ambulatory Visit | Attending: Cardiovascular Disease | Admitting: Cardiovascular Disease

## 2015-03-01 ENCOUNTER — Encounter (HOSPITAL_COMMUNITY)
Admission: RE | Disposition: A | Discharge status: Home / Self Care | Payer: Self-pay | Source: Ambulatory Visit | Attending: Cardiovascular Disease

## 2015-03-01 ENCOUNTER — Encounter (HOSPITAL_COMMUNITY): Payer: Self-pay | Admitting: *Deleted

## 2015-03-01 DIAGNOSIS — N400 Enlarged prostate without lower urinary tract symptoms: Secondary | ICD-10-CM | POA: Diagnosis not present

## 2015-03-01 DIAGNOSIS — I420 Dilated cardiomyopathy: Secondary | ICD-10-CM | POA: Insufficient documentation

## 2015-03-01 DIAGNOSIS — I4892 Unspecified atrial flutter: Secondary | ICD-10-CM | POA: Diagnosis not present

## 2015-03-01 DIAGNOSIS — E669 Obesity, unspecified: Secondary | ICD-10-CM | POA: Diagnosis not present

## 2015-03-01 DIAGNOSIS — I10 Essential (primary) hypertension: Secondary | ICD-10-CM | POA: Insufficient documentation

## 2015-03-01 DIAGNOSIS — E785 Hyperlipidemia, unspecified: Secondary | ICD-10-CM | POA: Insufficient documentation

## 2015-03-01 DIAGNOSIS — Z87891 Personal history of nicotine dependence: Secondary | ICD-10-CM | POA: Insufficient documentation

## 2015-03-01 HISTORY — PX: CARDIOVERSION: SHX1299

## 2015-03-01 SURGERY — CARDIOVERSION
Anesthesia: General

## 2015-03-01 MED ORDER — LIDOCAINE HCL (CARDIAC) 20 MG/ML IV SOLN
INTRAVENOUS | Status: DC | PRN
  Administered 2015-03-01: 60 mg via INTRAVENOUS

## 2015-03-01 MED ORDER — SODIUM CHLORIDE 0.9 % IJ SOLN: 3.0000 mL | Freq: Two times a day (BID) | INTRAMUSCULAR | Status: DC

## 2015-03-01 MED ORDER — SODIUM CHLORIDE 0.9 % IV SOLN
250.0000 mL | INTRAVENOUS | Status: DC
  Administered 2015-03-01: 10:00:00 via INTRAVENOUS

## 2015-03-01 MED ORDER — SODIUM CHLORIDE 0.9 % IV SOLN
INTRAVENOUS | Status: DC
  Administered 2015-03-01: 500 mL via INTRAVENOUS

## 2015-03-01 MED ORDER — PROPOFOL 10 MG/ML IV BOLUS
INTRAVENOUS | Status: DC | PRN
  Administered 2015-03-01: 80 mg via INTRAVENOUS

## 2015-03-01 MED ORDER — SODIUM CHLORIDE 0.9 % IJ SOLN: 3.0000 mL | INTRAMUSCULAR | Status: DC | PRN

## 2015-03-01 NOTE — Op Note (Signed)
Procedure: Electrical Cardioversion Indications:  Atrial Flutter  Procedure Details:  Consent: Risks of procedure as well as the alternatives and risks of each were explained to the (patient/caregiver).  Consent for procedure obtained.  Time Out: Verified patient identification, verified procedure, site/side was marked, verified correct patient position, special equipment/implants available, medications/allergies/relevent history reviewed, required imaging and test results available.  Performed  Patient placed on cardiac monitor, pulse oximetry, supplemental oxygen as necessary.  Sedation given: propofol 80 mg IV, Dr. Tobias Alexander Pacer pads placed anterior and posterior chest.  Cardioverted 1 time(s).  Cardioverted at biphasic synchronized 120J  Evaluation: Findings: Post procedure EKG shows: NSR Complications: None Patient did tolerate procedure well.  Time Spent Directly with the Patient:  60 minutes   Lyanne Kates 03/01/2015, 10:04 AM

## 2015-03-01 NOTE — Interval H&P Note (Signed)
History and Physical Interval Note:  03/01/2015 8:36 AM  Travis Chase  has presented today for surgery, with the diagnosis of AFLUTTER  The various methods of treatment have been discussed with the patient and family. After consideration of risks, benefits and other options for treatment, the patient has consented to  Procedure(s): CARDIOVERSION (N/A) as a surgical intervention .  The patient's history has been reviewed, patient examined, no change in status, stable for surgery.  I have reviewed the patient's chart and labs.  Questions were answered to the patient's satisfaction.     [name]

## 2015-03-01 NOTE — Discharge Instructions (Signed)
Electrical Cardioversion Electrical cardioversion is the delivery of a jolt of electricity to change the rhythm of the heart. Sticky patches or metal paddles are placed on the chest to deliver the electricity from a device. This is done to restore a normal rhythm. A rhythm that is too fast or not regular keeps the heart from pumping well. Electrical cardioversion is done in an emergency if:   There is low or no blood pressure as a result of the heart rhythm.   Normal rhythm must be restored as fast as possible to protect the brain and heart from further damage.   It may save a life. Cardioversion may be done for heart rhythms that are not immediately life threatening, such as atrial fibrillation or flutter, in which:   The heart is beating too fast or is not regular.   Medicine to change the rhythm has not worked.   It is safe to wait in order to allow time for preparation.  Symptoms of the abnormal rhythm are bothersome.  The risk of stroke and other serious problems can be reduced. LET [facility] CARE PROVIDER KNOW ABOUT:   Any allergies you have.  All medicines you are taking, including vitamins, herbs, eye drops, creams, and over-the-counter medicines.  Previous problems you or members of your family have had with the use of anesthetics.   Any blood disorders you have.   Previous surgeries you have had.   Medical conditions you have. RISKS AND COMPLICATIONS  Generally, this is a safe procedure. However, problems can occur and include:   Breathing problems related to the anesthetic used.  A blood clot that breaks free and travels to other parts of your body. This could cause a stroke or other problems. The risk of this is lowered by use of blood-thinning medicine (anticoagulant) prior to the procedure.  Cardiac arrest (rare). BEFORE THE PROCEDURE   You may have tests to detect blood clots in your heart and to evaluate heart function.  You may start taking  anticoagulants so your blood does not clot as easily.   Medicines may be given to help stabilize your heart rate and rhythm. PROCEDURE  You will be given medicine through an IV tube to reduce discomfort and make you sleepy (sedative).   An electrical shock will be delivered. AFTER THE PROCEDURE Your heart rhythm will be watched to make sure it does not change.  Document Released: 10/18/2002 Document Revised: 03/14/2014 Document Reviewed: 05/12/2013 [facility] Patient Information 2015 [place], [place]. This information is not intended to replace advice given to you by your health care provider. Make sure you discuss any questions you have with your health care provider. Cardioverter Defibrillator Implantation, Care After Refer to this sheet in the next few weeks. These instructions provide you with information on caring for yourself after your procedure. Your health care provider may also give you more specific instructions. Your treatment has been planned according to current medical practices, but problems sometimes occur. Call your health care provider if you have any problems or questions after your procedure.  WHAT TO EXPECT AFTER THE PROCEDURE  You may feel pain. Some pain is normal. It may last a few days.  A slight bump may be seen over the skin where the device was placed. Sometimes, it is possible to feel the device under the skin. This is normal.  In the months and years afterward, your health care provider will check the device, the leads, and the battery every few months. Eventually, when  the battery is low, the device will be replaced. HOME CARE INSTRUCTIONS  Medicines  Take medicines only as directed by your health care provider.  If you were prescribed an antibiotic medicine, finish it all even if you start to feel better.   Do not take any other medicines without asking your health care provider first. Some medicines, including certain painkillers, can cause bleeding after  surgery.  Wound Care  Do not remove the bandage on your chest until directed to do so by your health care provider.  Once your bandage is removed, you may see pieces of tape called skin adhesive strips over the area where the cut was made (incision site). Let them fall off on their own.   Check the incision site every day to make sure it is not infected, bleeding, or starting to pull apart.  Do not use lotions or ointments near the incision site unless directed to do so.   Keep the incision area clean and dry for 2-3 days after the procedure or as directed by your health care provider. It takes several weeks for the incision site to completely heal.   Do not take baths, swim, or use a hot tub until your health care provider approves. Activities  Try to walk a little every day. Exercising is important after this procedure. It is also important to use your shoulder on the side of the pacemaker in daily tasks that do not require exaggerated motion.  Avoid sudden jerking, pulling, or chopping movements that pull your upper arm far away from your body for at least 6 weeks.  Do not lift your upper arm above your shoulders for at least 6 weeks. This means no tennis, golf, or swimming for this period of time. If you sleep with the arm above your head, use a restraint to prevent this from happening as you sleep.  You may go back to work when your health care provider says it is okay. Check with your health care provider before you start to drive or play sports.  Other Instructions  Follow diet instructions if they were provided. You should be able to eat what you usually do right away, but you may need to limit your salt intake.   Weigh yourself every day. If you suddenly gain weight, fluid may be building up in your body.   Always carry your pacemaker identification card with you. The card should list the implant date, device model, and manufacturer. Consider wearing a medical alert  bracelet or necklace.  Tell all health care providers that you have a pacemaker. This may prevent them from giving you a magnetic resonance imaging (MRI) scan because of the strong magnets used during that test.  If you must pass through a metal detector, quickly walk through it. Do not stop under the detector or stand near it.  Avoid places or objects with a strong electric or magnetic field, including:   Engineer, maintenance. When at the airport, let officials know you have a pacemaker. Your ID card will let you be checked in a way that is safe for you and that will not damage your pacemaker. Also, do not let a security person wave a magnetic wand near your pacemaker. That can make it stop working.  Power plants.   Large electrical generators.   Radiofrequency transmission towers, such as cell phone and radio towers.   Do not use amateur (ham) radio equipment or electric (arc) welding torches. Some devices are safe to use  if held at least 1 foot from your pacemaker. These include power tools, lawn mowers, and speakers. If you are unsure of whether something is safe to use, ask your health care provider.   You may safely use electric blankets, heating pads, computers, and microwave ovens.   When using your cell phone, hold it to the ear opposite the pacemaker. Do not leave your cell phone in a pocket over the pacemaker.   Keep all follow-up visits as directed by your health care provider. This is how your health care provider makes sure your chest is healing the way it should. Ask your health care provider when you should come back to have your stitches or staples taken out.   Have your pacemaker checked every 3-6 months or as directed by your health care provider. Most pacemakers last for 4-8 years before a new one is needed. SEEK MEDICAL CARE IF:   You feel one shock in your chest.  You gain weight suddenly.   Your legs or feet swell more than they have before.   It  feels like your heart is fluttering or skipping beats (heart palpitations).  You have a fever. SEEK IMMEDIATE MEDICAL CARE IF:   You have chest pain.  You feel more than one shock.  You feel more short of breath than you have felt before.  You feel more light-headed than you have felt before.  You have problems with your incision site, such as swelling or bleeding, or it starts to open up.   You notice signs of infection around your incision site. Watch for:   Warmth.   Redness.   Worsening pain.   Swelling.   Fluid leaking from the incision site.  Document Released: 05/17/2005 Document Revised: 03/14/2014 Document Reviewed: 11/18/2012 [facility] Patient Information 2015 [place], [place]. This information is not intended to replace advice given to you by your health care provider. Make sure you discuss any questions you have with your health care provider.

## 2015-03-01 NOTE — Anesthesia Preprocedure Evaluation (Addendum)
Anesthesia Evaluation  Patient identified by MRN, date of birth, ID band Patient awake    Reviewed: Allergy & Precautions, NPO status , Patient's Chart, lab work & pertinent test results  History of Anesthesia Complications Negative for: history of anesthetic complications  Airway Mallampati: II  TM Distance: >3 FB Neck ROM: Full    Dental  (+) Teeth Intact, Dental Advisory Given   Pulmonary former smoker,  breath sounds clear to auscultation        Cardiovascular hypertension, Rhythm:Irregular Rate:Tachycardia     Neuro/Psych negative neurological ROS  negative psych ROS   GI/Hepatic negative GI ROS, Neg liver ROS,   Endo/Other  negative endocrine ROS  Renal/GU negative Renal ROS     Musculoskeletal   Abdominal   Peds  Hematology   Anesthesia Other Findings   Reproductive/Obstetrics                            Anesthesia Physical Anesthesia Plan  ASA: III  Anesthesia Plan: General   Post-op Pain Management:    Induction: Intravenous  Airway Management Planned: Mask  Additional Equipment:   Intra-op Plan:   Post-operative Plan:   Informed Consent: I have reviewed the patients History and Physical, chart, labs and discussed the procedure including the risks, benefits and alternatives for the proposed anesthesia with the patient or authorized representative who has indicated his/her understanding and acceptance.   Dental advisory given  Plan Discussed with: CRNA, Anesthesiologist and Surgeon  Anesthesia Plan Comments:        Anesthesia Quick Evaluation

## 2015-03-01 NOTE — Anesthesia Postprocedure Evaluation (Signed)
  Anesthesia Post-op Note  Patient: Travis Chase  Procedure(s) Performed: Procedure(s): CARDIOVERSION (N/A)  Patient Location: Endoscopy Unit  Anesthesia Type:General  Level of Consciousness: awake and alert   Airway and Oxygen Therapy: Patient Spontanous Breathing  Post-op Pain: none  Post-op Assessment: Post-op Vital signs reviewed, Patient's Cardiovascular Status Stable and Respiratory Function Stable  Post-op Vital Signs: Reviewed and stable  Last Vitals:  Filed Vitals:   03/01/15 1005  BP: 130/100  Pulse:   Resp: 18    Complications: No apparent anesthesia complications

## 2015-03-01 NOTE — Transfer of Care (Signed)
Immediate Anesthesia Transfer of Care Note  Patient: Travis Chase  Procedure(s) Performed: Procedure(s): CARDIOVERSION (N/A)  Patient Location: Endoscopy Unit  Anesthesia Type:General  Level of Consciousness: sedated  Airway & Oxygen Therapy: Patient Spontanous Breathing and Patient connected to nasal cannula oxygen  Post-op Assessment: Report given to RN and Post -op Vital signs reviewed and stable  Post vital signs: Reviewed and stable  Last Vitals:  Filed Vitals:   03/01/15 1005  BP: 130/100  Pulse:   Resp: 18    Complications: No apparent anesthesia complications

## 2015-03-01 NOTE — Anesthesia Procedure Notes (Signed)
Procedure Name: MAC Date/Time: 03/01/2015 10:00 AM Performed by: Suzy Bouchard Pre-anesthesia Checklist: Patient identified, Patient being monitored, Emergency Drugs available, Timeout performed and Suction available Patient Re-evaluated:Patient Re-evaluated prior to inductionOxygen Delivery Method: Ambu bag Preoxygenation: Pre-oxygenation with 100% oxygen Intubation Type: IV induction Ventilation: Mask ventilation without difficulty

## 2015-03-01 NOTE — Telephone Encounter (Signed)
lmom x 3 for lab results. Also sent results to [place]. If any questions call [phone].

## 2015-03-02 ENCOUNTER — Encounter (HOSPITAL_COMMUNITY): Payer: Self-pay | Admitting: Cardiovascular Disease

## 2015-03-08 ENCOUNTER — Ambulatory Visit (HOSPITAL_COMMUNITY)
Admission: RE | Admit: 2015-03-08 | Discharge: 2015-03-08 | Disposition: A | Discharge status: Home / Self Care | Payer: Medicare Other | Source: Ambulatory Visit | Attending: Cardiology | Admitting: Cardiology

## 2015-03-08 DIAGNOSIS — R931 Abnormal findings on diagnostic imaging of heart and coronary circulation: Secondary | ICD-10-CM

## 2015-03-08 DIAGNOSIS — I429 Cardiomyopathy, unspecified: Secondary | ICD-10-CM | POA: Insufficient documentation

## 2015-03-08 MED ORDER — GADOBENATE DIMEGLUMINE 529 MG/ML IV SOLN
30.0000 mL | Freq: Once | INTRAVENOUS | Status: AC | PRN
  Administered 2015-03-08: 30 mL via INTRAVENOUS

## 2015-03-10 ENCOUNTER — Encounter: Payer: Self-pay | Admitting: Cardiology

## 2015-03-10 NOTE — Telephone Encounter (Signed)
Follow Up      Pt returning phone call for lab results.

## 2015-03-10 NOTE — Telephone Encounter (Signed)
This encounter was created in error - please disregard.
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
## 2015-03-16 ENCOUNTER — Encounter: Payer: Self-pay | Admitting: Cardiology

## 2015-03-16 ENCOUNTER — Ambulatory Visit (INDEPENDENT_AMBULATORY_CARE_PROVIDER_SITE_OTHER): Payer: Medicare Other | Admitting: Cardiology

## 2015-03-16 VITALS — BP 120/82 | HR 148 | Ht 70.0 in | Wt 218.8 lb

## 2015-03-16 DIAGNOSIS — I10 Essential (primary) hypertension: Secondary | ICD-10-CM | POA: Diagnosis not present

## 2015-03-16 DIAGNOSIS — I4892 Unspecified atrial flutter: Secondary | ICD-10-CM | POA: Diagnosis not present

## 2015-03-16 DIAGNOSIS — E669 Obesity, unspecified: Secondary | ICD-10-CM | POA: Diagnosis not present

## 2015-03-16 MED ORDER — METOPROLOL TARTRATE 50 MG PO TABS
50.0000 mg | ORAL_TABLET | Freq: Two times a day (BID) | ORAL | Status: DC
Start: 2015-03-16 — End: 2015-03-17

## 2015-03-16 NOTE — Patient Instructions (Addendum)
Medication Instructions:  Your physician has recommended you make the following change in your medication: 1) INCREASE METOPROLOL to 50 mg TWICE DAILY  Labwork: None  Testing/Procedures: None  Follow-Up: You have an EKG scheduled 03/17/15 at 9:00AM.  You have a follow-up appointment on June 22, 2015 at 9:15AM.  Cardiac Ablation Cardiac ablation is a procedure to disable a small amount of heart tissue in very specific places. The heart has many electrical connections. Sometimes these connections are abnormal and can cause the heart to beat very fast or irregularly. By disabling some of the problem areas, heart rhythm can be improved or made normal. Ablation is done for people who:   Have Wolff-Parkinson-White syndrome.   Have other fast heart rhythms (tachycardia).   Have taken medicines for an abnormal heart rhythm (arrhythmia) that resulted in:   No success.   Side effects.   May have a high-risk heartbeat that could result in death.  LET [facility] CARE PROVIDER KNOW ABOUT:   Any allergies you have or any previous reactions you have had to X-ray dye, food (such as seafood), medicine, or tape.   All medicines you are taking, including vitamins, herbs, eye drops, creams, and over-the-counter medicines.   Previous problems you or members of your family have had with the use of anesthetics.   Any blood disorders you have.   Previous surgeries or procedures (such as a kidney transplant) you have had.   Medical conditions you have (such as kidney failure).  RISKS AND COMPLICATIONS Generally, cardiac ablation is a safe procedure. However, problems can occur and include:   Increased risk of cancer. Depending on how long it takes to do the ablation, the dose of radiation can be high.  Bruising and bleeding where a thin, flexible tube (catheter) was inserted during the procedure.   Bleeding into the chest, especially into the sac that surrounds the heart  (serious).  Need for a permanent pacemaker if the normal electrical system is damaged.   The procedure may not be fully effective, and this may not be recognized for months. Repeat ablation procedures are sometimes required. BEFORE THE PROCEDURE   Follow any instructions from your health care provider regarding eating and drinking before the procedure.   Take your medicines as directed at regular times with water, unless instructed otherwise by your health care provider. If you are taking diabetes medicine, including insulin, ask how you are to take it and if there are any special instructions you should follow. It is common to adjust insulin dosing the day of the ablation.  PROCEDURE  An ablation is usually performed in a catheterization laboratory with the guidance of fluoroscopy. Fluoroscopy is a type of X-ray that helps your health care provider see images of your heart during the procedure.   An ablation is a minimally invasive procedure. This means a small cut (incision) is made in either your neck or groin. Your health care provider will decide where to make the incision based on your medical history and physical exam.  An IV tube will be started before the procedure begins. You will be given an anesthetic or medicine to help you relax (sedative).  The skin on your neck or groin will be numbed. A needle will be inserted into a large vein in your neck or groin and catheters will be threaded to your heart.  A special dye that shows up on fluoroscopy pictures may be injected through the catheter. The dye helps your health care provider see  the area of the heart that needs treatment.  The catheter has electrodes on the tip. When the area of heart tissue that is causing the arrhythmia is found, the catheter tip will send an electrical current to the area and "scar" the tissue. Three types of energy can be used to ablate the heart tissue:   Heat (radiofrequency energy).   Laser  energy.   Extreme cold (cryoablation).   When the area of the heart has been ablated, the catheter will be taken out. Pressure will be held on the insertion site. This will help the insertion site clot and keep it from bleeding. A bandage will be placed on the insertion site.  AFTER THE PROCEDURE   After the procedure, you will be taken to a recovery area where your vital signs (blood pressure, heart rate, and breathing) will be monitored. The insertion site will also be monitored for bleeding.   You will need to lie still for 4-6 hours. This is to ensure you do not bleed from the catheter insertion site.  Document Released: 03/16/2009 Document Revised: 03/14/2014 Document Reviewed: 03/22/2013 [facility] Patient Information 2015 [place], [place]. This information is not intended to replace advice given to you by your health care provider. Make sure you discuss any questions you have with your health care provider.

## 2015-03-17 ENCOUNTER — Ambulatory Visit (INDEPENDENT_AMBULATORY_CARE_PROVIDER_SITE_OTHER): Payer: Medicare Other

## 2015-03-17 VITALS — BP 116/70 | HR 131 | Resp 18 | Ht 70.0 in | Wt 217.0 lb

## 2015-03-17 DIAGNOSIS — I4892 Unspecified atrial flutter: Secondary | ICD-10-CM

## 2015-03-17 MED ORDER — METOPROLOL TARTRATE 50 MG PO TABS: 75.0000 mg | ORAL_TABLET | Freq: Two times a day (BID) | ORAL | Status: DC

## 2015-03-17 NOTE — Progress Notes (Signed)
1.) Reason for visit: EKG  2.) Name of MD requesting visit: Dr. Radford Pax  3.) H&P: Patient is a 69 year old male with a history of HTN, HLD, and recent atrial flutter noted on EKG from office visit with Dr. Radford Pax on 03/16/15. Patient's Metoprolol tartrate was increased to 50 mg by mouth twice daily. Patient has taken new dose twice so far.  4.) ROS related to problem: Patient's BP 116/70, HR 131, and  Resp. 18. Patient denies any chest pain, SOB, or dizziness. Patient states "I feel fine." Patient does not notice       when he is in atrial flutter. EKG today shows Atrial Flutter with variable AV Block.  5.) Assessment and plan per MD: Dr. Radford Pax reviewed EKG. She ordered an increase in Metoprolol from 50 mg to 75 mg by mouth twice daily. Patient is to see Dr. Lovena Le on      the next available appointment, which is 03/21/15 at 2:15 pm. Patient verbalized understanding of medication and appointment change.

## 2015-03-17 NOTE — Patient Instructions (Signed)
Medication Instructions:  Increase Metoprolol 75 mg by mouth twice daily  Labwork: NONE   Testing/Procedures: NONE  Follow-Up: Your physician recommends that you come back for your appointment with Dr. Lovena Le on 03/21/2015 at 2:15 pm.

## 2015-03-21 ENCOUNTER — Ambulatory Visit (INDEPENDENT_AMBULATORY_CARE_PROVIDER_SITE_OTHER): Payer: Medicare Other | Admitting: Internal Medicine

## 2015-03-21 ENCOUNTER — Encounter: Payer: Self-pay | Admitting: Internal Medicine

## 2015-03-21 VITALS — BP 120/84 | HR 119 | Ht 70.0 in | Wt 217.6 lb

## 2015-03-21 DIAGNOSIS — E785 Hyperlipidemia, unspecified: Secondary | ICD-10-CM

## 2015-03-21 DIAGNOSIS — I4892 Unspecified atrial flutter: Secondary | ICD-10-CM

## 2015-03-21 DIAGNOSIS — I10 Essential (primary) hypertension: Secondary | ICD-10-CM | POA: Diagnosis not present

## 2015-03-21 NOTE — Assessment & Plan Note (Signed)
His ventricular rate has been fairly well controlled. He will continue his current meds. I have discussed the risks/benefits/goals/expectations of catheter ablation and he wishes to proceed. This will be scheduled in the next few weeks.

## 2015-03-21 NOTE — Patient Instructions (Signed)
Medication Instructions:  Your physician recommends that you continue on your current medications as directed. Please refer to the Current Medication list given to you today.   Labwork: None ordered  Testing/Procedures: None ordered  Follow-Up: Your physician wants you to follow-up with Dr Radford Pax if you do not do ablation and if you have the ablation then will schedule follow up from hospital   Any Other Special Instructions Will Be Listed Below (If Applicable).  Your physician has recommended that you have an ablation. Catheter ablation is a medical procedure used to treat some cardiac arrhythmias (irregular heartbeats). During catheter ablation, a long, thin, flexible tube is put into a blood vessel in your groin (upper thigh), or neck. This tube is called an ablation catheter. It is then guided to your heart through the blood vessel. Radio frequency waves destroy small areas of heart tissue where abnormal heartbeats may cause an arrhythmia to start. Please see the instruction sheet given to you today.  Dates for procedure are: 5/23, 5/25, 6/2, 6/8, 6/10, 6/14, 6/14, 6/22, 6/29  Call Claiborne Billings if you decide to proceed [phone]
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
## 2015-03-21 NOTE — Assessment & Plan Note (Signed)
He will continue a low fat diet. Will follow.

## 2015-03-21 NOTE — Assessment & Plan Note (Signed)
His blood pressure is controlled. He will continue his current meds. He will maintain a low sodium diet.

## 2015-03-23 ENCOUNTER — Telehealth: Payer: Self-pay | Admitting: Internal Medicine

## 2015-03-23 DIAGNOSIS — I4892 Unspecified atrial flutter: Secondary | ICD-10-CM

## 2015-03-23 NOTE — Telephone Encounter (Signed)
Spoke with patient and let him know 04/05/15 was not open any longer  He is going to take 04/19/15 and I let him know I would call him Monday to schedule

## 2015-03-23 NOTE — Telephone Encounter (Signed)
New Message   Patient is returning the nurses call. Please call back

## 2015-03-23 NOTE — Telephone Encounter (Signed)
I tried tcb in regards to his call. I am not sure if pt is calling back to s/w Ar[place] Jansky RN to discuss about abalation see ov 5/10. Pt may aslo be returning my calls from when I was calling to go over lab results on 02/27/15.

## 2015-03-23 NOTE — Telephone Encounter (Signed)
Follow   Pt calling to speak w/ KElly about ablation- set up for 5/25. Please call back and discuss.

## 2015-03-28 ENCOUNTER — Institutional Professional Consult (permissible substitution): Payer: Medicare Other | Admitting: Internal Medicine

## 2015-03-31 ENCOUNTER — Encounter: Payer: Self-pay | Admitting: Internal Medicine

## 2015-03-31 NOTE — Telephone Encounter (Signed)
Patient will come in on 04/19/15 at 4:30 to have pre-procedure labs and get instruction sheet

## 2015-03-31 NOTE — Telephone Encounter (Signed)
lmom for patient and have asked they call me back to go over ablation for 04/19/15

## 2015-03-31 NOTE — Telephone Encounter (Signed)
This encounter was created in error - please disregard.

## 2015-03-31 NOTE — Telephone Encounter (Signed)
Follow Up  Pt called req a call back to discuss CATH procedure. Please call

## 2015-04-07 ENCOUNTER — Encounter: Payer: Self-pay | Admitting: *Deleted

## 2015-04-12 ENCOUNTER — Ambulatory Visit (HOSPITAL_BASED_OUTPATIENT_CLINIC_OR_DEPARTMENT_OTHER): Payer: Medicare Other | Attending: Physician Assistant | Admitting: *Deleted

## 2015-04-12 VITALS — Ht 70.0 in | Wt 215.0 lb

## 2015-04-12 DIAGNOSIS — R0683 Snoring: Secondary | ICD-10-CM

## 2015-04-12 DIAGNOSIS — G4733 Obstructive sleep apnea (adult) (pediatric): Secondary | ICD-10-CM

## 2015-04-12 DIAGNOSIS — E669 Obesity, unspecified: Secondary | ICD-10-CM | POA: Diagnosis not present

## 2015-04-12 DIAGNOSIS — I4892 Unspecified atrial flutter: Secondary | ICD-10-CM

## 2015-04-13 ENCOUNTER — Other Ambulatory Visit (INDEPENDENT_AMBULATORY_CARE_PROVIDER_SITE_OTHER): Payer: Medicare Other | Admitting: *Deleted

## 2015-04-13 DIAGNOSIS — I4892 Unspecified atrial flutter: Secondary | ICD-10-CM | POA: Diagnosis not present

## 2015-04-13 LAB — CBC WITH DIFFERENTIAL/PLATELET
Basophils Absolute: 0 10*3/uL (ref 0.0–0.1)
Basophils Relative: 0.2 % (ref 0.0–3.0)
Eosinophils Absolute: 0.2 10*3/uL (ref 0.0–0.7)
Eosinophils Relative: 2.5 % (ref 0.0–5.0)
HCT: 47.4 % (ref 39.0–52.0)
HEMOGLOBIN: 16.1 g/dL (ref 13.0–17.0)
LYMPHS ABS: 1.6 10*3/uL (ref 0.7–4.0)
LYMPHS PCT: 18.9 % (ref 12.0–46.0)
MCHC: 34 g/dL (ref 30.0–36.0)
MCV: 88.3 fl (ref 78.0–100.0)
Monocytes Absolute: 0.5 10*3/uL (ref 0.1–1.0)
Monocytes Relative: 6.4 % (ref 3.0–12.0)
Neutro Abs: 6.2 10*3/uL (ref 1.4–7.7)
Neutrophils Relative %: 72 % (ref 43.0–77.0)
Platelets: 216 10*3/uL (ref 150.0–400.0)
RBC: 5.37 Mil/uL (ref 4.22–5.81)
RDW: 12.6 % (ref 11.5–15.5)
WBC: 8.6 10*3/uL (ref 4.0–10.5)

## 2015-04-13 LAB — BASIC METABOLIC PANEL
BUN: 12 mg/dL (ref 6–23)
CALCIUM: 9.1 mg/dL (ref 8.4–10.5)
CO2: 28 mEq/L (ref 19–32)
Chloride: 103 mEq/L (ref 96–112)
Creatinine, Ser: 0.96 mg/dL (ref 0.40–1.50)
GFR: 82.13 mL/min (ref >60.00)
Glucose, Bld: 117 mg/dL — ABNORMAL HIGH (ref 70–99)
Potassium: 4.2 mEq/L (ref 3.5–5.1)
Sodium: 137 mEq/L (ref 135–145)

## 2015-04-17 ENCOUNTER — Encounter: Payer: Self-pay | Admitting: Internal Medicine

## 2015-04-18 ENCOUNTER — Encounter: Payer: Self-pay | Admitting: *Deleted

## 2015-04-18 ENCOUNTER — Telehealth: Payer: Self-pay | Admitting: Internal Medicine

## 2015-04-18 NOTE — Telephone Encounter (Signed)
Follow Up    Pt called to discuss ablation dirrections. Please call

## 2015-04-18 NOTE — Telephone Encounter (Signed)
Left message to call back. Letter on chart for instructions.

## 2015-04-18 NOTE — Telephone Encounter (Signed)
Reviewed with Dr. Lovena Le and pt does not need additional lab work prior to procedure.  I spoke with pt and reviewed instructions with him.  Will send instructions to him via my chart per his request.

## 2015-04-19 ENCOUNTER — Ambulatory Visit (HOSPITAL_COMMUNITY)
Admission: RE | Admit: 2015-04-19 | Discharge: 2015-04-19 | Disposition: A | Discharge status: Home / Self Care | Payer: Medicare Other | Source: Ambulatory Visit | Attending: Internal Medicine | Admitting: Internal Medicine

## 2015-04-19 ENCOUNTER — Encounter (HOSPITAL_COMMUNITY)
Admission: RE | Disposition: A | Discharge status: Home / Self Care | Payer: Medicare Other | Source: Ambulatory Visit | Attending: Internal Medicine

## 2015-04-19 ENCOUNTER — Encounter (HOSPITAL_COMMUNITY): Payer: Self-pay | Admitting: Internal Medicine

## 2015-04-19 DIAGNOSIS — I10 Essential (primary) hypertension: Secondary | ICD-10-CM | POA: Diagnosis not present

## 2015-04-19 DIAGNOSIS — Z7901 Long term (current) use of anticoagulants: Secondary | ICD-10-CM | POA: Diagnosis not present

## 2015-04-19 DIAGNOSIS — E785 Hyperlipidemia, unspecified: Secondary | ICD-10-CM | POA: Diagnosis not present

## 2015-04-19 DIAGNOSIS — I471 Supraventricular tachycardia: Secondary | ICD-10-CM | POA: Insufficient documentation

## 2015-04-19 DIAGNOSIS — N400 Enlarged prostate without lower urinary tract symptoms: Secondary | ICD-10-CM | POA: Insufficient documentation

## 2015-04-19 DIAGNOSIS — I483 Typical atrial flutter: Secondary | ICD-10-CM | POA: Insufficient documentation

## 2015-04-19 DIAGNOSIS — Z87891 Personal history of nicotine dependence: Secondary | ICD-10-CM | POA: Diagnosis not present

## 2015-04-19 DIAGNOSIS — I4892 Unspecified atrial flutter: Secondary | ICD-10-CM | POA: Diagnosis present

## 2015-04-19 HISTORY — PX: ELECTROPHYSIOLOGIC STUDY: SHX172A

## 2015-04-19 HISTORY — DX: Malignant neoplasm of prostate: C61

## 2015-04-19 SURGERY — A-FLUTTER/A-TACH/SVT ABLATION

## 2015-04-19 MED ORDER — SODIUM CHLORIDE 0.9 % IJ SOLN: 3.0000 mL | Freq: Two times a day (BID) | INTRAMUSCULAR | Status: DC

## 2015-04-19 MED ORDER — SODIUM CHLORIDE 0.9 % IJ SOLN: 3.0000 mL | INTRAMUSCULAR | Status: DC | PRN

## 2015-04-19 MED ORDER — HEPARIN SODIUM (PORCINE) 1000 UNIT/ML IJ SOLN
INTRAMUSCULAR | Status: AC
  Filled 2015-04-19: qty 1

## 2015-04-19 MED ORDER — OFF THE BEAT BOOK
Freq: Once | Status: AC
  Administered 2015-04-19: 18:00:00
  Filled 2015-04-19: qty 1

## 2015-04-19 MED ORDER — ONDANSETRON HCL 4 MG/2ML IJ SOLN: 4.0000 mg | Freq: Four times a day (QID) | INTRAMUSCULAR | Status: DC | PRN

## 2015-04-19 MED ORDER — ACETAMINOPHEN 325 MG PO TABS: 650.0000 mg | ORAL_TABLET | ORAL | Status: DC | PRN

## 2015-04-19 MED ORDER — MIDAZOLAM HCL 5 MG/5ML IJ SOLN
INTRAMUSCULAR | Status: DC | PRN
  Administered 2015-04-19: 1 mg via INTRAVENOUS
  Administered 2015-04-19: 2 mg via INTRAVENOUS
  Administered 2015-04-19 (×3): 1 mg via INTRAVENOUS

## 2015-04-19 MED ORDER — SODIUM CHLORIDE 0.9 % IV SOLN
INTRAVENOUS | Status: DC
  Administered 2015-04-19: 07:00:00 via INTRAVENOUS

## 2015-04-19 MED ORDER — BUPIVACAINE HCL (PF) 0.25 % IJ SOLN
INTRAMUSCULAR | Status: AC
  Filled 2015-04-19: qty 60

## 2015-04-19 MED ORDER — BUPIVACAINE HCL (PF) 0.25 % IJ SOLN
INTRAMUSCULAR | Status: DC | PRN
Start: 2015-04-19 — End: 2015-04-19
  Administered 2015-04-19: 30 mL

## 2015-04-19 MED ORDER — METOPROLOL TARTRATE 25 MG PO TABS: 50.0000 mg | ORAL_TABLET | Freq: Two times a day (BID) | ORAL | Status: DC

## 2015-04-19 MED ORDER — MIDAZOLAM HCL 5 MG/5ML IJ SOLN
INTRAMUSCULAR | Status: AC
  Filled 2015-04-19: qty 5

## 2015-04-19 MED ORDER — FENTANYL CITRATE (PF) 100 MCG/2ML IJ SOLN
INTRAMUSCULAR | Status: AC
  Filled 2015-04-19: qty 2

## 2015-04-19 MED ORDER — SODIUM CHLORIDE 0.9 % IV SOLN: 250.0000 mL | INTRAVENOUS | Status: DC | PRN

## 2015-04-19 MED ORDER — HEPARIN (PORCINE) IN NACL 2-0.9 UNIT/ML-% IJ SOLN
INTRAMUSCULAR | Status: AC
  Filled 2015-04-19: qty 500

## 2015-04-19 MED ORDER — FENTANYL CITRATE (PF) 100 MCG/2ML IJ SOLN
INTRAMUSCULAR | Status: DC | PRN
  Administered 2015-04-19 (×2): 12.5 ug via INTRAVENOUS
  Administered 2015-04-19: 25 ug via INTRAVENOUS
  Administered 2015-04-19 (×2): 12.5 ug via INTRAVENOUS

## 2015-04-19 MED ORDER — RIVAROXABAN 20 MG PO TABS
20.0000 mg | ORAL_TABLET | Freq: Every day | ORAL | Status: DC
  Filled 2015-04-19: qty 1

## 2015-04-19 SURGICAL SUPPLY — 11 items
BAG SNAP BAND KOVER 36X36 (MISCELLANEOUS) ×2 IMPLANT
CATH BLAZERPRIME XP (ABLATOR) ×2 IMPLANT
CATH JOSEPHSON QUAD-ALLRED 6FR (CATHETERS) ×2 IMPLANT
CATH POLARIS X 2.5/5/2.5 DECAP (CATHETERS) ×2 IMPLANT
PACK EP LATEX FREE (CUSTOM PROCEDURE TRAY) ×1
PACK EP LF (CUSTOM PROCEDURE TRAY) ×1 IMPLANT
PAD DEFIB LIFELINK (PAD) ×2 IMPLANT
SHEATH PINNACLE 6F 10CM (SHEATH) ×2 IMPLANT
SHEATH PINNACLE 7F 10CM (SHEATH) ×2 IMPLANT
SHEATH PINNACLE 8F 10CM (SHEATH) ×2 IMPLANT
SHIELD RADPAD SCOOP 12X17 (MISCELLANEOUS) ×2 IMPLANT
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
## 2015-04-19 NOTE — Discharge Instructions (Signed)
No driving for 3 days. No lifting over 5 lbs for 1 week. No sexual activity for 1 week. You may return to work in 1 week.  Keep procedure site clean & dry. If you notice increased pain, swelling, bleeding or pus, call/return!  You may shower, but no soaking baths/hot tubs/pools for 1 week.  ° ° °

## 2015-04-19 NOTE — Progress Notes (Signed)
Site area:right groin a 6.7, 8 venous sheath was removed  Site Prior to Removal:  Level 0  Pressure Applied For 15 MINUTES    Minutes Beginning at 0945a  Manual:   Yes.    Patient Status During Pull:  stable  Post Pull Groin Site:  Level 0  Post Pull Instructions Given:  Yes.    Post Pull Pulses Present:  Yes.    Dressing Applied:  Yes.    Comments:  VS remain stable during sheath pull.  Pt resting with eyes closed

## 2015-04-19 NOTE — Interval H&P Note (Signed)
History and Physical Interval Note:  04/19/2015 6:42 AM  Travis Chase  has presented today for surgery, with the diagnosis of aflutter  The various methods of treatment have been discussed with the patient and family. After consideration of risks, benefits and other options for treatment, the patient has consented to  Procedure(s): A-Flutter/A-Tach/SVT Ablation (N/A) as a surgical intervention .  The patient's history has been reviewed, patient examined, no change in status, stable for surgery.  I have reviewed the patient's chart and labs.  Questions were answered to the patient's satisfaction.     Mikle Bosworth.D.

## 2015-04-19 NOTE — Progress Notes (Signed)
Doing well post ablation Discharge to home tonight Continue Xarelto for 4 weeks uninterrupted Instructions reviewed with patient Follow up with Dr Lovena Le in 4 weeks  Chanetta Marshall, NP 04/19/2015 4:37 PM  EP Attending  Agree with above. [place] for [employer] home.  Mikle Bosworth.D.

## 2015-04-20 ENCOUNTER — Emergency Department (HOSPITAL_COMMUNITY)
Admission: EM | Admit: 2015-04-20 | Discharge: 2015-04-20 | Disposition: A | Discharge status: Home / Self Care | Payer: Medicare Other | Attending: Emergency Medicine | Admitting: Emergency Medicine

## 2015-04-20 ENCOUNTER — Emergency Department (HOSPITAL_COMMUNITY): Payer: Medicare Other

## 2015-04-20 ENCOUNTER — Encounter (HOSPITAL_COMMUNITY): Payer: Self-pay

## 2015-04-20 DIAGNOSIS — Z79899 Other long term (current) drug therapy: Secondary | ICD-10-CM | POA: Insufficient documentation

## 2015-04-20 DIAGNOSIS — M1990 Unspecified osteoarthritis, unspecified site: Secondary | ICD-10-CM | POA: Diagnosis not present

## 2015-04-20 DIAGNOSIS — I5022 Chronic systolic (congestive) heart failure: Secondary | ICD-10-CM | POA: Diagnosis not present

## 2015-04-20 DIAGNOSIS — I483 Typical atrial flutter: Secondary | ICD-10-CM

## 2015-04-20 DIAGNOSIS — I4892 Unspecified atrial flutter: Secondary | ICD-10-CM | POA: Diagnosis not present

## 2015-04-20 DIAGNOSIS — Z8669 Personal history of other diseases of the nervous system and sense organs: Secondary | ICD-10-CM | POA: Insufficient documentation

## 2015-04-20 DIAGNOSIS — Z87891 Personal history of nicotine dependence: Secondary | ICD-10-CM | POA: Diagnosis not present

## 2015-04-20 DIAGNOSIS — E669 Obesity, unspecified: Secondary | ICD-10-CM | POA: Insufficient documentation

## 2015-04-20 DIAGNOSIS — Z8546 Personal history of malignant neoplasm of prostate: Secondary | ICD-10-CM | POA: Insufficient documentation

## 2015-04-20 DIAGNOSIS — I5042 Chronic combined systolic (congestive) and diastolic (congestive) heart failure: Secondary | ICD-10-CM | POA: Insufficient documentation

## 2015-04-20 DIAGNOSIS — R0789 Other chest pain: Secondary | ICD-10-CM | POA: Diagnosis not present

## 2015-04-20 DIAGNOSIS — I10 Essential (primary) hypertension: Secondary | ICD-10-CM | POA: Diagnosis not present

## 2015-04-20 DIAGNOSIS — Z86010 Personal history of colonic polyps: Secondary | ICD-10-CM | POA: Diagnosis not present

## 2015-04-20 DIAGNOSIS — Z87448 Personal history of other diseases of urinary system: Secondary | ICD-10-CM | POA: Insufficient documentation

## 2015-04-20 DIAGNOSIS — R079 Chest pain, unspecified: Secondary | ICD-10-CM | POA: Diagnosis present

## 2015-04-20 LAB — BASIC METABOLIC PANEL
ANION GAP: 6 (ref 5–15)
BUN: 11 mg/dL (ref 6–20)
CO2: 27 mmol/L (ref 22–32)
CREATININE: 0.89 mg/dL (ref 0.61–1.24)
Calcium: 8.8 mg/dL — ABNORMAL LOW (ref 8.9–10.3)
Chloride: 102 mmol/L (ref 101–111)
GFR calc Af Amer: >60 mL/min (ref >60)
GFR calc non Af Amer: >60 mL/min (ref >60)
GLUCOSE: 155 mg/dL — AB (ref 65–99)
Potassium: 4.2 mmol/L (ref 3.5–5.1)
Sodium: 135 mmol/L (ref 135–145)

## 2015-04-20 LAB — CBC
HEMATOCRIT: 42.2 % (ref 39.0–52.0)
Hemoglobin: 14.4 g/dL (ref 13.0–17.0)
MCH: 30.6 pg (ref 26.0–34.0)
MCHC: 34.1 g/dL (ref 30.0–36.0)
MCV: 89.8 fL (ref 78.0–100.0)
Platelets: 169 10*3/uL (ref 150–400)
RBC: 4.7 MIL/uL (ref 4.22–5.81)
RDW: 12.4 % (ref 11.5–15.5)
WBC: 10.2 10*3/uL (ref 4.0–10.5)

## 2015-04-20 LAB — I-STAT TROPONIN, ED: Troponin i, poc: 0.33 ng/mL (ref 0.00–0.08)

## 2015-04-20 MED ORDER — MORPHINE SULFATE 4 MG/ML IJ SOLN
4.0000 mg | Freq: Once | INTRAMUSCULAR | Status: DC
  Filled 2015-04-20: qty 1

## 2015-04-20 MED ORDER — KETOROLAC TROMETHAMINE 15 MG/ML IJ SOLN
15.0000 mg | Freq: Once | INTRAMUSCULAR | Status: AC
Start: 2015-04-20 — End: 2015-04-20
  Administered 2015-04-20: 15 mg via INTRAVENOUS
  Filled 2015-04-20: qty 1

## 2015-04-20 MED FILL — Heparin Sodium (Porcine) 2 Unit/ML in Sodium Chloride 0.9%: INTRAMUSCULAR | Qty: 500 | Status: AC

## 2015-04-20 NOTE — ED Notes (Signed)
Pt had an ablation 04/19/15 for A flutter.  Pt reports he woke up this morning at 0600 with SOB and epigastric/central chest pressure.

## 2015-04-20 NOTE — Discharge Instructions (Signed)

## 2015-04-20 NOTE — ED Notes (Signed)
Cardiology at bedside.
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
## 2015-04-20 NOTE — ED Notes (Signed)
Dr. Rayann Heman, at bedside doing bedside echo. Reports pt is good to go home, will place consult notes and med for pain.

## 2015-04-20 NOTE — ED Notes (Signed)
Patient transported to X-ray 
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
## 2015-04-26 SURGERY — LEFT HEART CATH

## 2015-04-27 ENCOUNTER — Telehealth: Payer: Self-pay | Admitting: Cardiology

## 2015-04-27 ENCOUNTER — Encounter (HOSPITAL_BASED_OUTPATIENT_CLINIC_OR_DEPARTMENT_OTHER): Payer: Self-pay | Admitting: *Deleted

## 2015-04-27 DIAGNOSIS — G4733 Obstructive sleep apnea (adult) (pediatric): Secondary | ICD-10-CM

## 2015-04-27 HISTORY — DX: Obstructive sleep apnea (adult) (pediatric): G47.33

## 2015-04-27 NOTE — Sleep Study (Signed)
   NAME: Travis Chase DATE OF BIRTH:  [birth year] MEDICAL RECORD NUMBER [id number]  LOCATION: [facility]  PHYSICIAN: Debera Sterba R  DATE OF STUDY: 04/12/2015  SLEEP STUDY TYPE: Nocturnal Polysomnogram               REFERRING PHYSICIAN: Charlie Pitter, PA-C  INDICATION FOR STUDY: snoring, witnessed apnea  EPWORTH SLEEPINESS SCORE: 8 HEIGHT: 5\' 10"  (177.8 cm)  WEIGHT: 215 lb (97.523 kg)    Body mass index is 30.85 kg/(m^2).  NECK SIZE: 16 in.  MEDICATIONS: Reviewed in the chart  SLEEP ARCHITECTURE: The patient slept for a total of 270 minutes out of a total sleep period time of 363 minutes.  There was no slow wave sleep and 58 minutes of REM sleep.  The onset to sleep latency was normal at 7.5 minutes and onset to REM sleep latency was normal at 80 minutes.  The sleep efficiency was reduced at 73%.    RESPIRATORY DATA: There were 5 obstructive apneas and 59 hypopneas.  The AHI was 14.2 events per hour consistent with mild to moderate obstructive sleep apnea/hypopnea syndrome.  Most events occurred in the non-supine position with equal distribution between REM and NREM sleep.  There was mild snoring with evidence of mouth breathing.  OXYGEN DATA: The average oxygen saturation was 95% and the lowest oxygen saturation was 87%.  The time spent with oxygen saturations < 88% was 1.6 minutes.    CARDIAC DATA: The patient maintained atrial flutter throughout the study.  The average heart rate was 135 bpm.  The lowest heart rate was 44 bpm.  The fastest heart rate was 167 bpm.    MOVEMENT/PARASOMNIA: The patient had evidence of periodic limb movements with an increased PLMS index of 59 per hour.  There were no REM slepe behavior disorders.  IMPRESSION/ RECOMMENDATION:   1.  Mild to moderate obstructive sleep apnea/hypopnea syndrome with an AHI of 14.2 events per hour.   Most events occurred in the non-supine position with equal distribution between REM and NREM sleep.   2.   Reduced sleep efficiency with increased frequency of arousals due to respiratory events.  3.  Abnormal sleep architecture with no slow wave sleep. 4.  The patient maintained atrial flutter throughout the study with an average heart rate of 135 bpm.   5.  Oxygen desaturations as low as 87% with respiratory evetns.   6.  Mild snoring was noted. 7.  Auto-titrating CPAP (continuous positive airway pressure) or a CPAP titration would be appropriate.  Treatment would also include careful attention to proper sleep hygiene, weight reduction for elevated BMI, avoidance of sleeping in the supine position and avoidance of alcohol within four hours of bedtime.  Specific treatment decisions should be tailored to each patient based upon the clinical situation and all treatment options should be considered.  The patient should be instructed to avoid driving if sleepy and careful clinical follow up is needed to ensure that the patient's symptoms are improving with therapy and the PAP adherence is supported and measured if prescribed.    Signed: Sueanne Margarita Diplomate, American Board of Sleep Medicine  ELECTRONICALLY SIGNED ON:  04/27/2015, 9:00 PM [place] PH: [phone]   FX: [phone] [place]

## 2015-04-27 NOTE — Telephone Encounter (Signed)
Please let patient know that they have mild to moderate sleep apnea and recommend CPAP titration. Please set up titration in the sleep lab.

## 2015-04-28 ENCOUNTER — Other Ambulatory Visit: Payer: Self-pay | Admitting: *Deleted

## 2015-04-28 ENCOUNTER — Encounter: Payer: Self-pay | Admitting: *Deleted

## 2015-04-28 DIAGNOSIS — G4733 Obstructive sleep apnea (adult) (pediatric): Secondary | ICD-10-CM

## 2015-04-28 NOTE — Telephone Encounter (Signed)
Patient is aware of results. CPAP titration is scheduled for 07/11/15. Letter of confirmation has been sent to patient.

## 2015-05-09 ENCOUNTER — Telehealth: Payer: Self-pay | Admitting: Cardiology

## 2015-05-09 NOTE — Telephone Encounter (Signed)
New problem   Pt returning your call concerning sleep study appt. Please call pt.

## 2015-05-09 NOTE — Telephone Encounter (Signed)
Patient was just calling to let me know that he received my letter about his titration date

## 2015-05-18 ENCOUNTER — Encounter: Payer: Self-pay | Admitting: Internal Medicine

## 2015-05-18 ENCOUNTER — Ambulatory Visit (INDEPENDENT_AMBULATORY_CARE_PROVIDER_SITE_OTHER): Payer: Medicare Other | Admitting: Internal Medicine

## 2015-05-18 VITALS — BP 114/82 | HR 66 | Ht 70.0 in | Wt 214.2 lb

## 2015-05-18 DIAGNOSIS — E785 Hyperlipidemia, unspecified: Secondary | ICD-10-CM

## 2015-05-18 DIAGNOSIS — I4892 Unspecified atrial flutter: Secondary | ICD-10-CM | POA: Diagnosis not present

## 2015-05-18 DIAGNOSIS — R0789 Other chest pain: Secondary | ICD-10-CM

## 2015-05-18 DIAGNOSIS — I483 Typical atrial flutter: Secondary | ICD-10-CM | POA: Diagnosis not present

## 2015-05-18 MED ORDER — METOPROLOL TARTRATE 25 MG PO TABS: 25.0000 mg | ORAL_TABLET | Freq: Two times a day (BID) | ORAL | Status: DC

## 2015-05-18 MED ORDER — ASPIRIN EC 81 MG PO TBEC: 81.0000 mg | DELAYED_RELEASE_TABLET | Freq: Every day | ORAL | Status: DC

## 2015-05-18 NOTE — Assessment & Plan Note (Signed)
He had pleuritic chest pain after ablation which has resolved.

## 2015-05-18 NOTE — Assessment & Plan Note (Signed)
He will maintain a low fat diet. 
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
## 2015-05-18 NOTE — Patient Instructions (Signed)
Medication Instructions:  Your physician has recommended you make the following change in your medication: 1) DECREASE Metoprolol to 25 mg twice a day 2) STOP Xarelto 3) START Aspirin 81 mg daily  Labwork: None ordered  Testing/Procedures: None ordered  Follow-Up: No follow up is needed at this time with Dr. Lovena Le.  He will see you on an as needed basis.   Thank you for choosing [facility]!!

## 2015-05-18 NOTE — Assessment & Plan Note (Signed)
He is doing well and maintaining NSR, s/p ablation. He may stop xarelto. I have asked that he reduce his dose of metoprolol.
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
## 2015-06-22 ENCOUNTER — Ambulatory Visit (INDEPENDENT_AMBULATORY_CARE_PROVIDER_SITE_OTHER): Payer: Medicare Other | Admitting: Cardiology

## 2015-06-22 ENCOUNTER — Encounter: Payer: Self-pay | Admitting: Cardiology

## 2015-06-22 VITALS — BP 110/72 | HR 67 | Ht 70.0 in | Wt 210.4 lb

## 2015-06-22 DIAGNOSIS — I483 Typical atrial flutter: Secondary | ICD-10-CM | POA: Diagnosis not present

## 2015-06-22 DIAGNOSIS — I5032 Chronic diastolic (congestive) heart failure: Secondary | ICD-10-CM

## 2015-06-22 DIAGNOSIS — E669 Obesity, unspecified: Secondary | ICD-10-CM | POA: Diagnosis not present

## 2015-06-22 DIAGNOSIS — I10 Essential (primary) hypertension: Secondary | ICD-10-CM | POA: Diagnosis not present

## 2015-06-22 DIAGNOSIS — G4733 Obstructive sleep apnea (adult) (pediatric): Secondary | ICD-10-CM

## 2015-06-22 MED ORDER — METOPROLOL TARTRATE 25 MG PO TABS: 25.0000 mg | ORAL_TABLET | Freq: Two times a day (BID) | ORAL | Status: DC

## 2015-06-22 NOTE — Patient Instructions (Addendum)
Medication Instructions:  Your physician has recommended you make the following change in your medication:  1) DECREASE METOPROLOL to 25 mg TWICE DAILY  Labwork: None  Testing/Procedures: None  Follow-Up: Your physician wants you to follow-up in: 1 year with Dr. Radford Pax. You will receive a reminder letter in the mail two months in advance. If you don't receive a letter, please call our office to schedule the follow-up appointment.   Any Other Special Instructions Will Be Listed Below (If Applicable).

## 2015-07-11 ENCOUNTER — Encounter (HOSPITAL_BASED_OUTPATIENT_CLINIC_OR_DEPARTMENT_OTHER): Payer: Medicare Other

## 2016-01-04 DIAGNOSIS — H52223 Regular astigmatism, bilateral: Secondary | ICD-10-CM | POA: Diagnosis not present

## 2016-01-04 DIAGNOSIS — H5213 Myopia, bilateral: Secondary | ICD-10-CM | POA: Diagnosis not present

## 2016-01-04 DIAGNOSIS — I10 Essential (primary) hypertension: Secondary | ICD-10-CM | POA: Diagnosis not present

## 2016-01-04 DIAGNOSIS — H43813 Vitreous degeneration, bilateral: Secondary | ICD-10-CM | POA: Diagnosis not present

## 2016-01-04 DIAGNOSIS — H524 Presbyopia: Secondary | ICD-10-CM | POA: Diagnosis not present

## 2016-01-04 DIAGNOSIS — H25813 Combined forms of age-related cataract, bilateral: Secondary | ICD-10-CM | POA: Diagnosis not present

## 2016-01-04 DIAGNOSIS — H353131 Nonexudative age-related macular degeneration, bilateral, early dry stage: Secondary | ICD-10-CM | POA: Diagnosis not present

## 2016-01-04 DIAGNOSIS — H43393 Other vitreous opacities, bilateral: Secondary | ICD-10-CM | POA: Diagnosis not present

## 2016-02-05 DIAGNOSIS — I4892 Unspecified atrial flutter: Secondary | ICD-10-CM | POA: Diagnosis not present

## 2016-02-05 DIAGNOSIS — Z0000 Encounter for general adult medical examination without abnormal findings: Secondary | ICD-10-CM | POA: Diagnosis not present

## 2016-02-05 DIAGNOSIS — I10 Essential (primary) hypertension: Secondary | ICD-10-CM | POA: Diagnosis not present

## 2016-02-05 DIAGNOSIS — I5042 Chronic combined systolic (congestive) and diastolic (congestive) heart failure: Secondary | ICD-10-CM | POA: Diagnosis not present

## 2016-02-05 DIAGNOSIS — Z79899 Other long term (current) drug therapy: Secondary | ICD-10-CM | POA: Diagnosis not present

## 2016-02-05 DIAGNOSIS — R739 Hyperglycemia, unspecified: Secondary | ICD-10-CM | POA: Diagnosis not present

## 2016-02-05 DIAGNOSIS — E785 Hyperlipidemia, unspecified: Secondary | ICD-10-CM | POA: Diagnosis not present

## 2016-02-05 DIAGNOSIS — G4733 Obstructive sleep apnea (adult) (pediatric): Secondary | ICD-10-CM | POA: Diagnosis not present

## 2016-02-05 DIAGNOSIS — I420 Dilated cardiomyopathy: Secondary | ICD-10-CM | POA: Diagnosis not present

## 2016-02-05 DIAGNOSIS — C61 Malignant neoplasm of prostate: Secondary | ICD-10-CM | POA: Diagnosis not present

## 2016-05-10 DIAGNOSIS — Z8546 Personal history of malignant neoplasm of prostate: Secondary | ICD-10-CM | POA: Diagnosis not present

## 2016-05-10 DIAGNOSIS — N5201 Erectile dysfunction due to arterial insufficiency: Secondary | ICD-10-CM | POA: Diagnosis not present

## 2016-06-23 ENCOUNTER — Other Ambulatory Visit: Payer: Self-pay | Admitting: Internal Medicine

## 2016-06-23 DIAGNOSIS — I483 Typical atrial flutter: Secondary | ICD-10-CM

## 2016-09-05 ENCOUNTER — Other Ambulatory Visit: Payer: Self-pay | Admitting: Internal Medicine

## 2016-09-05 DIAGNOSIS — I483 Typical atrial flutter: Secondary | ICD-10-CM

## 2016-09-18 ENCOUNTER — Other Ambulatory Visit: Payer: Self-pay | Admitting: Internal Medicine

## 2016-09-18 DIAGNOSIS — I483 Typical atrial flutter: Secondary | ICD-10-CM

## 2016-10-07 ENCOUNTER — Telehealth: Payer: Self-pay | Admitting: Cardiology

## 2016-10-07 NOTE — Telephone Encounter (Signed)
Patient called to report he received a message to call and schedule an appointment with Richardson Dopp. Spoke with Nicki Reaper and his assist, who state they did not call the patient.  Returned call to schedule overdue appointment with Dr. Radford Pax.  The patient reports intermittent "chest discomfort" for a little over a month. He states the pain is "right in the middle of his sternum bone" and is not constant. It doesn't seem to be associated with any particular activity and it just goes away with time. He also reports he has talked about this sensation with doctors before and was told not to worry about it. He reports the sensation does not radiate and has never "scared him" into going to the Emergency Room.  Scheduled patient for overdue evaluation this Wednesday.  He understands to go to the ED if symptoms worsen in the meantime. He was grateful for call.

## 2016-10-07 NOTE — Telephone Encounter (Signed)
Patient wants to know if he should schedule an appointment with Dr. Radford Pax or "f/u ablation with Richardson Dopp".

## 2016-10-08 ENCOUNTER — Encounter: Payer: Self-pay | Admitting: Nurse Practitioner

## 2016-10-09 ENCOUNTER — Encounter: Payer: Self-pay | Admitting: Nurse Practitioner

## 2016-10-09 ENCOUNTER — Ambulatory Visit (INDEPENDENT_AMBULATORY_CARE_PROVIDER_SITE_OTHER): Payer: Medicare Other | Admitting: Nurse Practitioner

## 2016-10-09 VITALS — BP 148/100 | HR 68 | Ht 70.0 in | Wt 215.8 lb

## 2016-10-09 DIAGNOSIS — R0789 Other chest pain: Secondary | ICD-10-CM

## 2016-10-09 DIAGNOSIS — I10 Essential (primary) hypertension: Secondary | ICD-10-CM

## 2016-10-09 DIAGNOSIS — E7800 Pure hypercholesterolemia, unspecified: Secondary | ICD-10-CM | POA: Diagnosis not present

## 2016-10-09 DIAGNOSIS — I5032 Chronic diastolic (congestive) heart failure: Secondary | ICD-10-CM | POA: Diagnosis not present

## 2016-10-09 NOTE — Patient Instructions (Addendum)
We will be checking the following labs today - NONE   Medication Instructions:    Continue with your current medicines.   Please take your Metoprolol twice a day    Testing/Procedures To Be Arranged:  N/A  Follow-Up:   See Dr. Radford Pax in 3 months    Other Special Instructions:   Monitor your BP at home for the next several weeks - call with an update - ask to speak to [facility] - Dr. Theodosia Blender nurse  Try to restrict your salt as best you can     If you need a refill on your cardiac medications before your next appointment, please call your pharmacy.   Call the [place] office at [phone] if you have any questions, problems or concerns.
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
## 2016-10-23 ENCOUNTER — Other Ambulatory Visit: Payer: Self-pay | Admitting: Cardiology

## 2016-10-23 DIAGNOSIS — I483 Typical atrial flutter: Secondary | ICD-10-CM

## 2016-11-01 DIAGNOSIS — B355 Tinea imbricata: Secondary | ICD-10-CM | POA: Diagnosis not present

## 2016-12-02 ENCOUNTER — Telehealth: Payer: Self-pay | Admitting: Cardiology

## 2016-12-02 NOTE — Telephone Encounter (Signed)
New Message  Pt voiced he received a message stating to call and speak with Lenice Llamas, MD-[facility] nurse.

## 2016-12-02 NOTE — Telephone Encounter (Signed)
Per Lori's last OV note,  "Other Special Instructions:   Monitor your BP at home for the next several weeks - call with an update - ask to speak to [facility] - Dr. Theodosia Blender nurse  Try to restrict your salt as best you can"    Left message to call back.

## 2016-12-03 NOTE — Telephone Encounter (Signed)
Follow Up:   Pt calling to give you his blood pressure readings.

## 2016-12-03 NOTE — Telephone Encounter (Signed)
Please find out if he has been compliant with his BP meds

## 2016-12-03 NOTE — Telephone Encounter (Signed)
Yes - confirmed with patient he has been taking Metoprolol 25 mg BID.

## 2016-12-03 NOTE — Telephone Encounter (Signed)
FOLLOW UP CALL:   Per pt his BP has not changed and would like a call back please.

## 2016-12-03 NOTE — Telephone Encounter (Signed)
Left message for patient to call with a list of blood pressure readings to review if he calls during clinic tomorrow.

## 2016-12-03 NOTE — Telephone Encounter (Signed)
Patient states since he saw Cecille Rubin, his blood pressure has stayed at 150/100 with a variance of 5-10 on top or bottom. He states it doesn't matter when he takes his BP during the day or in relation to taking his medication - it is always right at 150/100. He does not add salt to his food and he reports he is snacking less on salty treats. He takes Metoprolol 25 mg BID. He reports he is asymptomatic for the most part, but has had a "slight headache" in the mornings for the past 2-3 weeks.  To Dr. Radford Pax for medication recommendations.

## 2016-12-04 NOTE — Telephone Encounter (Signed)
Please find out what his HR has been running

## 2016-12-04 NOTE — Telephone Encounter (Signed)
Increase metoprolol to 50mg  BID and check BP and HR daily for a week and call with results

## 2016-12-04 NOTE — Telephone Encounter (Signed)
Patient reports his HR stays in the 82s.

## 2016-12-05 MED ORDER — METOPROLOL TARTRATE 50 MG PO TABS: 50.0000 mg | ORAL_TABLET | Freq: Two times a day (BID) | ORAL | 11 refills | Status: DC

## 2016-12-05 NOTE — Telephone Encounter (Signed)
Instructed patient to INCREASE METOPROLOL to 50 mg BID. He will call next week with blood pressure and HR readings. He agrees with treatment plan.

## 2016-12-26 ENCOUNTER — Encounter: Payer: Self-pay | Admitting: Cardiology

## 2016-12-26 ENCOUNTER — Encounter: Payer: Self-pay | Admitting: *Deleted

## 2016-12-26 ENCOUNTER — Ambulatory Visit (INDEPENDENT_AMBULATORY_CARE_PROVIDER_SITE_OTHER): Payer: Medicare Other | Admitting: Cardiology

## 2016-12-26 VITALS — BP 160/94 | HR 64 | Ht 70.0 in | Wt 222.2 lb

## 2016-12-26 DIAGNOSIS — I4892 Unspecified atrial flutter: Secondary | ICD-10-CM | POA: Diagnosis not present

## 2016-12-26 DIAGNOSIS — I10 Essential (primary) hypertension: Secondary | ICD-10-CM | POA: Diagnosis not present

## 2016-12-26 DIAGNOSIS — G4733 Obstructive sleep apnea (adult) (pediatric): Secondary | ICD-10-CM | POA: Diagnosis not present

## 2016-12-26 DIAGNOSIS — I5032 Chronic diastolic (congestive) heart failure: Secondary | ICD-10-CM | POA: Diagnosis not present

## 2016-12-26 DIAGNOSIS — I739 Peripheral vascular disease, unspecified: Secondary | ICD-10-CM

## 2016-12-26 DIAGNOSIS — I420 Dilated cardiomyopathy: Secondary | ICD-10-CM

## 2016-12-26 HISTORY — DX: Dilated cardiomyopathy: I42.0

## 2016-12-26 LAB — BASIC METABOLIC PANEL
BUN / CREAT RATIO: 14 (ref 10–24)
BUN: 12 mg/dL (ref 8–27)
CO2: 22 mmol/L (ref 18–29)
CREATININE: 0.84 mg/dL (ref 0.76–1.27)
Calcium: 9.2 mg/dL (ref 8.6–10.2)
Chloride: 99 mmol/L (ref 96–106)
GFR, EST AFRICAN AMERICAN: 101 mL/min/{1.73_m2} (ref >59)
GFR, EST NON AFRICAN AMERICAN: 87 mL/min/{1.73_m2} (ref >59)
Glucose: 95 mg/dL (ref 65–99)
POTASSIUM: 4.8 mmol/L (ref 3.5–5.2)
SODIUM: 138 mmol/L (ref 134–144)

## 2016-12-26 LAB — CBC WITH DIFFERENTIAL/PLATELET
BASOS: 1 %
Basophils Absolute: 0 10*3/uL (ref 0.0–0.2)
EOS (ABSOLUTE): 0.1 10*3/uL (ref 0.0–0.4)
EOS: 2 %
HEMATOCRIT: 44.3 % (ref 37.5–51.0)
Hemoglobin: 15.6 g/dL (ref 13.0–17.7)
IMMATURE GRANULOCYTES: 1 %
Immature Grans (Abs): 0 10*3/uL (ref 0.0–0.1)
Lymphocytes Absolute: 1.6 10*3/uL (ref 0.7–3.1)
Lymphs: 27 %
MCH: 30.9 pg (ref 26.6–33.0)
MCHC: 35.2 g/dL (ref 31.5–35.7)
MCV: 88 fL (ref 79–97)
MONOS ABS: 0.5 10*3/uL (ref 0.1–0.9)
Monocytes: 7 %
NEUTROS ABS: 3.8 10*3/uL (ref 1.4–7.0)
NEUTROS PCT: 62 %
Platelets: 232 10*3/uL (ref 150–379)
RBC: 5.05 x10E6/uL (ref 4.14–5.80)
RDW: 12.1 % — AB (ref 12.3–15.4)
WBC: 6.1 10*3/uL (ref 3.4–10.8)

## 2016-12-26 MED ORDER — RIVAROXABAN 20 MG PO TABS: 20.0000 mg | ORAL_TABLET | Freq: Every day | ORAL | 0 refills | Status: DC

## 2016-12-26 MED ORDER — RIVAROXABAN 20 MG PO TABS: 20.0000 mg | ORAL_TABLET | Freq: Every day | ORAL | 11 refills | Status: DC
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
## 2016-12-26 NOTE — Patient Instructions (Addendum)
Medication Instructions:  1) STOP ASPIRIN 2) START XARELTO 20 mg daily  Labwork: TODAY: BMET, CBC  Testing/Procedures: Your physician has requested that you have a lower extremity arterial duplex. During this test, ultrasound is used to evaluate arterial blood flow in the legs. Allow one hour for this exam. There are no restrictions or special instructions.   Dr. Radford Pax recommends you have a CPAP TITRATION. Nina (our CPAP assistant) will call you to schedule this.  Follow-Up: Your physician wants you to follow-up in: 6 months with Dr. Radford Pax. You will receive a reminder letter in the mail two months in advance. If you don't receive a letter, please call our office to schedule the follow-up appointment.   Any Other Special Instructions Will Be Listed Below (If Applicable). Please check your blood pressure daily for a week and call with results.     If you need a refill on your cardiac medications before your next appointment, please call your pharmacy.

## 2016-12-30 ENCOUNTER — Other Ambulatory Visit: Payer: Self-pay | Admitting: Cardiology

## 2016-12-30 DIAGNOSIS — I739 Peripheral vascular disease, unspecified: Secondary | ICD-10-CM

## 2017-01-06 ENCOUNTER — Ambulatory Visit (HOSPITAL_COMMUNITY)
Admission: RE | Admit: 2017-01-06 | Discharge: 2017-01-06 | Disposition: A | Discharge status: Home / Self Care | Payer: Medicare Other | Source: Ambulatory Visit | Attending: Cardiology | Admitting: Cardiology

## 2017-01-06 DIAGNOSIS — I739 Peripheral vascular disease, unspecified: Secondary | ICD-10-CM | POA: Insufficient documentation

## 2017-01-30 DIAGNOSIS — Z1211 Encounter for screening for malignant neoplasm of colon: Secondary | ICD-10-CM | POA: Diagnosis not present

## 2017-01-30 DIAGNOSIS — K5730 Diverticulosis of large intestine without perforation or abscess without bleeding: Secondary | ICD-10-CM | POA: Diagnosis not present

## 2017-01-30 DIAGNOSIS — D126 Benign neoplasm of colon, unspecified: Secondary | ICD-10-CM | POA: Diagnosis not present

## 2017-02-04 DIAGNOSIS — Z1211 Encounter for screening for malignant neoplasm of colon: Secondary | ICD-10-CM | POA: Diagnosis not present

## 2017-02-04 DIAGNOSIS — D126 Benign neoplasm of colon, unspecified: Secondary | ICD-10-CM | POA: Diagnosis not present

## 2017-02-14 ENCOUNTER — Encounter (HOSPITAL_BASED_OUTPATIENT_CLINIC_OR_DEPARTMENT_OTHER): Payer: Medicare Other

## 2017-02-17 DIAGNOSIS — E785 Hyperlipidemia, unspecified: Secondary | ICD-10-CM | POA: Diagnosis not present

## 2017-02-17 DIAGNOSIS — I10 Essential (primary) hypertension: Secondary | ICD-10-CM | POA: Diagnosis not present

## 2017-02-17 DIAGNOSIS — R7303 Prediabetes: Secondary | ICD-10-CM | POA: Diagnosis not present

## 2017-02-17 DIAGNOSIS — Z0000 Encounter for general adult medical examination without abnormal findings: Secondary | ICD-10-CM | POA: Diagnosis not present

## 2017-03-04 IMAGING — MR MR CARD MORPHOLOGY WO/W CM
18 of 19 series · 18 of 19 positions shown · IV contrast (25    Multihance)
Comparison: none

CLINICAL DATA: 68-year-old male with nonischemic cardiomyopathy.

EXAM:
CARDIAC MRI
TECHNIQUE: The patient was scanned on a 1.5 Tesla GE magnet. A dedicated
cardiac coil was used. Functional imaging was done using Fiesta
sequences. [DATE], and 4 chamber views were done to assess for RWMA's.
Modified Doppler rule using a short axis stack was used to
calculate an ejection fraction on a dedicated work station using
Circle software. The patient received 30 cc of Multihance. After 10
minutes inversion recovery sequences were used to assess for
infiltration and scar tissue.
CONTRAST:  30 cc  of Multihance

[Series 3: bSSFP · sagittal · 8.0mm · 1.48mm/px · 1 of 14 slices shown (1 of 5)]
[im 1/14]
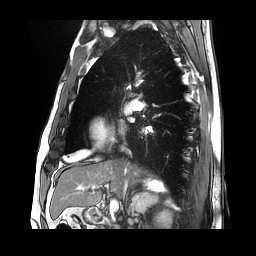

[Series 4: bSSFP · axial · 8.0mm · 1.48mm/px · 1 of 20 slices shown (2 of 5)]
[im 1/20]
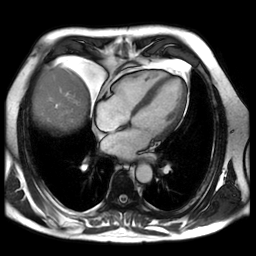

[Series 5: bSSFP · oblique · 8.0mm · 1.48mm/px · 1 of 320 slices shown (3 of 5)]
[im 1/320]
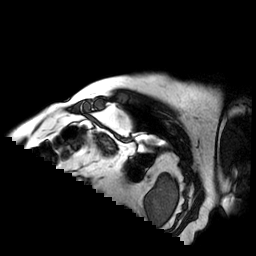

[Series 6: bSSFP · axial · 8.0mm · 1.48mm/px · 1 of 60 slices shown (4 of 5)]
[im 1/60]
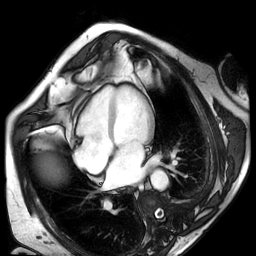

[Series 8: bSSFP · oblique · 8.0mm · 1.48mm/px · 1 of 20 slices shown (5 of 5)]
[im 1/20]
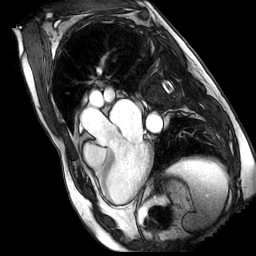

[Series 9: T2 · oblique · 8.0mm · 1.37mm/px · 1 of 60 slices shown (1 of 2)]
[im 1/60]
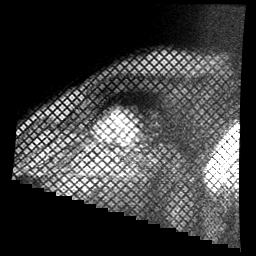

[Series 10: T2 · axial · 8.0mm · 1.48mm/px · 1 of 60 slices shown (2 of 2)]
[im 1/60]
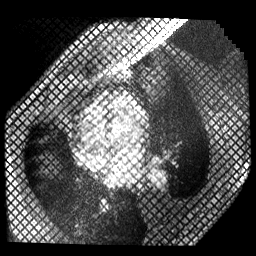

[Series 14: delayed ir prep · oblique · 8.0mm · 1.48mm/px · 1 of 5 slices shown (1 of 2)]
[im 1/5]
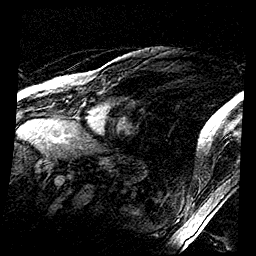

[Series 15: delayed ir prep · oblique · 8.0mm · 1.48mm/px · 1 of 9 slices shown (2 of 2)]
[im 1/9]
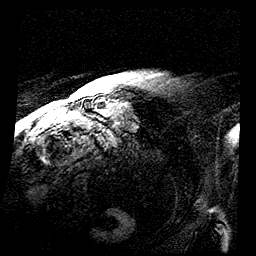

[Series 17: rad delayed ir · axial · 8.0mm · 1.48mm/px · 1 of 3 slices shown]
[im 1/3]
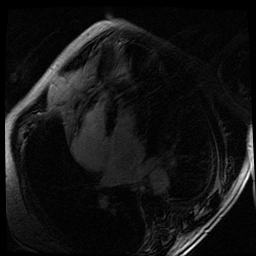

[Series 1900: me short axis · oblique · 8.0mm · 1.48mm/px · 1 of 12 slices shown]
[im 1/12]
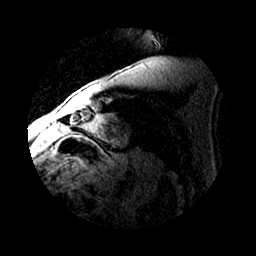

[Series 1950: long axis · oblique · 8.0mm · 2.81mm/px · 1 of 23 slices shown]
[im 1/23]
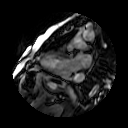

[Series 1951: sa base · oblique · 8.0mm · 2.81mm/px · 1 of 22 slices shown]
[im 1/22]
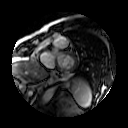

[Series 1952: sa mid · oblique · 8.0mm · 2.81mm/px · 1 of 22 slices shown]
[im 1/22]
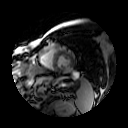

[Series 1953: sa apex · oblique · 8.0mm · 2.81mm/px · 1 of 22 slices shown]
[im 1/22]
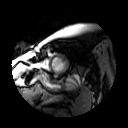

[Series 1954: 2 ch · oblique · 8.0mm · 2.81mm/px · 1 of 23 slices shown]
[im 1/23]
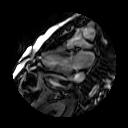

[Series 1955: 3 ch · coronal · 8.0mm · 2.81mm/px · 1 of 22 slices shown]
[im 1/22]
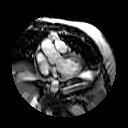

[Series 1956: 4 ch · axial · 8.0mm · 2.81mm/px · 1 of 23 slices shown]
[im 1/23]
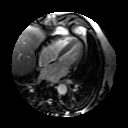

[18 of 19 positions shown; findings below may reference images not displayed]

FINDINGS: 1. Normal left ventricular size, mild basal septal hypertrophy and
mildly reduced systolic function (LVEF = 49%) with diffuse
hypokinesis.

LVEDD:  50 mm

LVESD:  36 mm

LVEDV:  130 ml

LVESV:  66 ml

SV:  63 ml

CO:  4.6 L/minute

2. Normal right ventricular size, thickness and systolic function
(RVEF = 47%) with normal regional wall motion.

RVEDV:  115 ml

RVESV:  61 ml

3.  Mild mitral and tricuspid regurgitation.

4.  Mild left and right atrial enlargement.

6. Normal caliber of the aortic root, ascending, descending aorta
and pulmonary artery.

7.  No late gadolinium enhancement seen the LV myocardium.
IMPRESSION: 1. Normal left ventricular size, mild basal septal hypertrophy and
mildly reduced systolic function (LVEF = 49%) with diffuse
hypokinesis. No late gadolinium enhancement seen the LV myocardium.

The patient had very frequent supraventricular ectopy during the
acquisition.

Collectively, these findings are consistent with non-ischemic
cardiomyopathy, most probably tachycardia induced.

2. Normal right ventricular size, thickness and systolic function
(RVEF = 47%) with normal regional wall motion.

3.  Mild mitral and tricuspid regurgitation.

4.  Mild left and right atrial enlargement.

Tanu Oxendine

## 2017-04-04 DIAGNOSIS — B355 Tinea imbricata: Secondary | ICD-10-CM | POA: Diagnosis not present

## 2017-04-17 DIAGNOSIS — B356 Tinea cruris: Secondary | ICD-10-CM | POA: Diagnosis not present

## 2017-04-17 DIAGNOSIS — M129 Arthropathy, unspecified: Secondary | ICD-10-CM | POA: Diagnosis not present

## 2017-05-05 DIAGNOSIS — H353131 Nonexudative age-related macular degeneration, bilateral, early dry stage: Secondary | ICD-10-CM | POA: Diagnosis not present

## 2017-05-05 DIAGNOSIS — H52223 Regular astigmatism, bilateral: Secondary | ICD-10-CM | POA: Diagnosis not present

## 2017-05-05 DIAGNOSIS — H5213 Myopia, bilateral: Secondary | ICD-10-CM | POA: Diagnosis not present

## 2017-05-05 DIAGNOSIS — H25813 Combined forms of age-related cataract, bilateral: Secondary | ICD-10-CM | POA: Diagnosis not present

## 2017-05-07 ENCOUNTER — Ambulatory Visit (INDEPENDENT_AMBULATORY_CARE_PROVIDER_SITE_OTHER): Payer: Medicare Other | Admitting: Orthopedic Surgery

## 2017-05-07 ENCOUNTER — Ambulatory Visit (INDEPENDENT_AMBULATORY_CARE_PROVIDER_SITE_OTHER): Payer: Medicare Other

## 2017-05-07 ENCOUNTER — Encounter (INDEPENDENT_AMBULATORY_CARE_PROVIDER_SITE_OTHER): Payer: Self-pay | Admitting: Orthopedic Surgery

## 2017-05-07 ENCOUNTER — Telehealth (INDEPENDENT_AMBULATORY_CARE_PROVIDER_SITE_OTHER): Payer: Self-pay | Admitting: Radiology

## 2017-05-07 DIAGNOSIS — M1712 Unilateral primary osteoarthritis, left knee: Secondary | ICD-10-CM | POA: Diagnosis not present

## 2017-05-07 DIAGNOSIS — M1711 Unilateral primary osteoarthritis, right knee: Secondary | ICD-10-CM | POA: Diagnosis not present

## 2017-05-07 DIAGNOSIS — M170 Bilateral primary osteoarthritis of knee: Secondary | ICD-10-CM

## 2017-05-07 NOTE — Telephone Encounter (Signed)
[place] [employer] and was advised CPT [phone] is covered at 80% and pt will owe 20% coinsurance OOP, no PA needed.

## 2017-05-08 DIAGNOSIS — M1711 Unilateral primary osteoarthritis, right knee: Secondary | ICD-10-CM

## 2017-05-08 DIAGNOSIS — M1712 Unilateral primary osteoarthritis, left knee: Secondary | ICD-10-CM

## 2017-05-08 MED ORDER — LIDOCAINE HCL 1 % IJ SOLN
5.0000 mL | INTRAMUSCULAR | Status: AC | PRN
  Administered 2017-05-08: 5 mL

## 2017-05-08 MED ORDER — HYLAN G-F 20 48 MG/6ML IX SOSY
48.0000 mg | PREFILLED_SYRINGE | INTRA_ARTICULAR | Status: AC | PRN
  Administered 2017-05-08: 48 mg via INTRA_ARTICULAR
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
## 2017-10-07 DIAGNOSIS — Z23 Encounter for immunization: Secondary | ICD-10-CM | POA: Diagnosis not present

## 2018-01-06 ENCOUNTER — Other Ambulatory Visit: Payer: Self-pay | Admitting: Cardiology

## 2018-01-27 ENCOUNTER — Other Ambulatory Visit: Payer: Self-pay | Admitting: Cardiology

## 2018-02-20 ENCOUNTER — Other Ambulatory Visit: Payer: Self-pay | Admitting: Cardiology

## 2018-03-04 ENCOUNTER — Other Ambulatory Visit: Payer: Self-pay | Admitting: Cardiology

## 2018-03-04 NOTE — Telephone Encounter (Signed)
I called patient and left message to call back and scheduled appt. Refill sent to pharmacy for 15 days.

## 2018-03-04 NOTE — Telephone Encounter (Signed)
Patient is overdue for an appointment and has been given multiple refills with msg to call and schedule an appointment. Patient also had a recall letter mailed to him. He has not scheduled an appointment. Please advise. Thanks, MI

## 2018-03-26 DIAGNOSIS — I10 Essential (primary) hypertension: Secondary | ICD-10-CM | POA: Diagnosis not present

## 2018-03-26 DIAGNOSIS — Z0000 Encounter for general adult medical examination without abnormal findings: Secondary | ICD-10-CM | POA: Diagnosis not present

## 2018-03-26 DIAGNOSIS — E785 Hyperlipidemia, unspecified: Secondary | ICD-10-CM | POA: Diagnosis not present

## 2018-03-26 DIAGNOSIS — Z79899 Other long term (current) drug therapy: Secondary | ICD-10-CM | POA: Diagnosis not present

## 2018-07-01 DIAGNOSIS — H52223 Regular astigmatism, bilateral: Secondary | ICD-10-CM | POA: Diagnosis not present

## 2018-07-01 DIAGNOSIS — H353131 Nonexudative age-related macular degeneration, bilateral, early dry stage: Secondary | ICD-10-CM | POA: Diagnosis not present

## 2018-07-01 DIAGNOSIS — H25813 Combined forms of age-related cataract, bilateral: Secondary | ICD-10-CM | POA: Diagnosis not present

## 2018-07-01 DIAGNOSIS — H5213 Myopia, bilateral: Secondary | ICD-10-CM | POA: Diagnosis not present

## 2018-07-06 DIAGNOSIS — R079 Chest pain, unspecified: Secondary | ICD-10-CM | POA: Diagnosis not present

## 2018-07-06 DIAGNOSIS — M5430 Sciatica, unspecified side: Secondary | ICD-10-CM | POA: Diagnosis not present

## 2018-07-16 ENCOUNTER — Telehealth: Payer: Self-pay

## 2018-07-16 NOTE — Telephone Encounter (Signed)
lpmtcb 9/5

## 2018-07-16 NOTE — Telephone Encounter (Signed)
° ° °  Please return call to patient °

## 2018-07-16 NOTE — Telephone Encounter (Signed)
If he is continuing to have CP he needs to go to ER

## 2018-07-16 NOTE — Telephone Encounter (Signed)
Left patient a message informing him that he should go to the ED for further CP evaluation per Dr Radford Pax.

## 2018-07-16 NOTE — Telephone Encounter (Signed)
Spoke to patient who was having some CP and sciatic nerve problems so he went to [facility] where he was started on Prednisone.  They did an EKG and ruled out heart problems.  After starting Prednisone, he began having palpitations, sweating and more CP.  He rates his CP at a 5 on a scale of 1-10.    He is still having palpitations since the completion of the Prednisone.  We have arranged an appt 9/10 here in the office.  I instructed him that if pain worsens, he should go to the ED.  He said that was "a last resort."

## 2018-07-19 ENCOUNTER — Other Ambulatory Visit: Payer: Self-pay

## 2018-07-19 ENCOUNTER — Inpatient Hospital Stay (HOSPITAL_COMMUNITY)
Admission: EM | Admit: 2018-07-19 | Discharge: 2018-07-21 | DRG: 309 | Disposition: A | Discharge status: Home / Self Care | Payer: Medicare Other | Attending: Cardiology | Admitting: Cardiology

## 2018-07-19 ENCOUNTER — Encounter (HOSPITAL_COMMUNITY): Payer: Self-pay | Admitting: Emergency Medicine

## 2018-07-19 ENCOUNTER — Emergency Department (HOSPITAL_COMMUNITY): Payer: Medicare Other

## 2018-07-19 DIAGNOSIS — Z8546 Personal history of malignant neoplasm of prostate: Secondary | ICD-10-CM

## 2018-07-19 DIAGNOSIS — E785 Hyperlipidemia, unspecified: Secondary | ICD-10-CM | POA: Diagnosis not present

## 2018-07-19 DIAGNOSIS — I420 Dilated cardiomyopathy: Secondary | ICD-10-CM | POA: Diagnosis present

## 2018-07-19 DIAGNOSIS — R0789 Other chest pain: Secondary | ICD-10-CM | POA: Diagnosis present

## 2018-07-19 DIAGNOSIS — Z6830 Body mass index (BMI) 30.0-30.9, adult: Secondary | ICD-10-CM

## 2018-07-19 DIAGNOSIS — I700 Atherosclerosis of aorta: Secondary | ICD-10-CM | POA: Diagnosis not present

## 2018-07-19 DIAGNOSIS — N400 Enlarged prostate without lower urinary tract symptoms: Secondary | ICD-10-CM | POA: Diagnosis present

## 2018-07-19 DIAGNOSIS — Z7982 Long term (current) use of aspirin: Secondary | ICD-10-CM | POA: Diagnosis not present

## 2018-07-19 DIAGNOSIS — G4733 Obstructive sleep apnea (adult) (pediatric): Secondary | ICD-10-CM | POA: Diagnosis not present

## 2018-07-19 DIAGNOSIS — Z79899 Other long term (current) drug therapy: Secondary | ICD-10-CM | POA: Diagnosis not present

## 2018-07-19 DIAGNOSIS — Z7901 Long term (current) use of anticoagulants: Secondary | ICD-10-CM

## 2018-07-19 DIAGNOSIS — I110 Hypertensive heart disease with heart failure: Secondary | ICD-10-CM | POA: Diagnosis present

## 2018-07-19 DIAGNOSIS — E669 Obesity, unspecified: Secondary | ICD-10-CM | POA: Diagnosis present

## 2018-07-19 DIAGNOSIS — I4891 Unspecified atrial fibrillation: Secondary | ICD-10-CM

## 2018-07-19 DIAGNOSIS — I482 Chronic atrial fibrillation: Principal | ICD-10-CM | POA: Diagnosis present

## 2018-07-19 DIAGNOSIS — I10 Essential (primary) hypertension: Secondary | ICD-10-CM | POA: Diagnosis not present

## 2018-07-19 DIAGNOSIS — K219 Gastro-esophageal reflux disease without esophagitis: Secondary | ICD-10-CM | POA: Diagnosis present

## 2018-07-19 DIAGNOSIS — Z23 Encounter for immunization: Secondary | ICD-10-CM

## 2018-07-19 DIAGNOSIS — R000 Tachycardia, unspecified: Secondary | ICD-10-CM | POA: Diagnosis not present

## 2018-07-19 DIAGNOSIS — Z87891 Personal history of nicotine dependence: Secondary | ICD-10-CM | POA: Diagnosis not present

## 2018-07-19 DIAGNOSIS — I4892 Unspecified atrial flutter: Secondary | ICD-10-CM | POA: Diagnosis present

## 2018-07-19 DIAGNOSIS — I5042 Chronic combined systolic (congestive) and diastolic (congestive) heart failure: Secondary | ICD-10-CM | POA: Diagnosis not present

## 2018-07-19 LAB — CBC
HCT: 47.7 % (ref 39.0–52.0)
Hemoglobin: 15.6 g/dL (ref 13.0–17.0)
MCH: 30.7 pg (ref 26.0–34.0)
MCHC: 32.7 g/dL (ref 30.0–36.0)
MCV: 93.9 fL (ref 78.0–100.0)
PLATELETS: 210 10*3/uL (ref 150–400)
RBC: 5.08 MIL/uL (ref 4.22–5.81)
RDW: 11.6 % (ref 11.5–15.5)
WBC: 9 10*3/uL (ref 4.0–10.5)

## 2018-07-19 LAB — BASIC METABOLIC PANEL
Anion gap: 8 (ref 5–15)
BUN: 12 mg/dL (ref 8–23)
CALCIUM: 8.9 mg/dL (ref 8.9–10.3)
CO2: 25 mmol/L (ref 22–32)
CREATININE: 0.88 mg/dL (ref 0.61–1.24)
Chloride: 106 mmol/L (ref 98–111)
GFR calc Af Amer: >60 mL/min (ref >60)
GLUCOSE: 102 mg/dL — AB (ref 70–99)
Potassium: 4 mmol/L (ref 3.5–5.1)
SODIUM: 139 mmol/L (ref 135–145)

## 2018-07-19 LAB — I-STAT TROPONIN, ED: TROPONIN I, POC: 0.04 ng/mL (ref 0.00–0.08)

## 2018-07-19 LAB — MAGNESIUM: MAGNESIUM: 2.2 mg/dL (ref 1.7–2.4)

## 2018-07-19 MED ORDER — DILTIAZEM HCL-DEXTROSE 100-5 MG/100ML-% IV SOLN (PREMIX)
5.0000 mg/h | INTRAVENOUS | Status: DC
  Administered 2018-07-19: 5 mg/h via INTRAVENOUS
  Filled 2018-07-19: qty 100

## 2018-07-19 MED ORDER — ONDANSETRON HCL 4 MG/2ML IJ SOLN: 4.0000 mg | Freq: Four times a day (QID) | INTRAMUSCULAR | Status: DC | PRN

## 2018-07-19 MED ORDER — HEPARIN BOLUS VIA INFUSION
4500.0000 [IU] | Freq: Once | INTRAVENOUS | Status: AC
  Administered 2018-07-19: 4500 [IU] via INTRAVENOUS
  Filled 2018-07-19: qty 4500

## 2018-07-19 MED ORDER — METOPROLOL TARTRATE 5 MG/5ML IV SOLN: 2.5000 mg | INTRAVENOUS | Status: AC

## 2018-07-19 MED ORDER — PNEUMOCOCCAL VAC POLYVALENT 25 MCG/0.5ML IJ INJ
0.5000 mL | INJECTION | INTRAMUSCULAR | Status: AC
  Administered 2018-07-20: 0.5 mL via INTRAMUSCULAR
  Filled 2018-07-19: qty 0.5

## 2018-07-19 MED ORDER — INFLUENZA VAC SPLIT HIGH-DOSE 0.5 ML IM SUSY
0.5000 mL | PREFILLED_SYRINGE | INTRAMUSCULAR | Status: AC
  Administered 2018-07-20: 0.5 mL via INTRAMUSCULAR
  Filled 2018-07-19: qty 0.5

## 2018-07-19 MED ORDER — HEPARIN (PORCINE) IN NACL 100-0.45 UNIT/ML-% IJ SOLN
1350.0000 [IU]/h | INTRAMUSCULAR | Status: DC
  Administered 2018-07-19: 1350 [IU]/h via INTRAVENOUS
  Filled 2018-07-19: qty 250

## 2018-07-19 MED ORDER — ACETAMINOPHEN 325 MG PO TABS: 650.0000 mg | ORAL_TABLET | ORAL | Status: DC | PRN

## 2018-07-19 MED ORDER — PANTOPRAZOLE SODIUM 40 MG PO TBEC
40.0000 mg | DELAYED_RELEASE_TABLET | Freq: Every day | ORAL | Status: DC
  Administered 2018-07-19 – 2018-07-21 (×3): 40 mg via ORAL
  Filled 2018-07-19 (×3): qty 1

## 2018-07-19 MED ORDER — DILTIAZEM HCL-DEXTROSE 100-5 MG/100ML-% IV SOLN (PREMIX): 5.0000 mg/h | INTRAVENOUS | Status: DC

## 2018-07-19 MED ORDER — DILTIAZEM LOAD VIA INFUSION
20.0000 mg | Freq: Once | INTRAVENOUS | Status: AC
  Administered 2018-07-19: 20 mg via INTRAVENOUS
  Filled 2018-07-19: qty 20

## 2018-07-19 MED ORDER — METOPROLOL TARTRATE 25 MG PO TABS
25.0000 mg | ORAL_TABLET | Freq: Two times a day (BID) | ORAL | Status: DC
  Administered 2018-07-19 – 2018-07-21 (×5): 25 mg via ORAL
  Filled 2018-07-19 (×5): qty 1

## 2018-07-19 MED ORDER — RIVAROXABAN 20 MG PO TABS
20.0000 mg | ORAL_TABLET | Freq: Every day | ORAL | Status: DC
  Administered 2018-07-19 – 2018-07-20 (×2): 20 mg via ORAL
  Filled 2018-07-19 (×3): qty 1

## 2018-07-19 NOTE — Progress Notes (Addendum)
ANTICOAGULATION CONSULT NOTE - Initial Consult  Pharmacy Consult:  Heparin Indication:  Afib with RVR  No Known Allergies  Patient Measurements: Height: 5\' 10"  (177.8 cm) Weight: 213 lb (96.6 kg) IBW/kg (Calculated) : 73 Heparin Dosing Weight: 93 kg  Vital Signs: Temp: 98.6 F (37 C) (09/08 1253) BP: 126/73 (09/08 1345) Pulse Rate: 56 (09/08 1345)  Labs: Recent Labs    07/19/18 1258  HGB 15.6  HCT 47.7  PLT 210  CREATININE 0.88    Estimated Creatinine Clearance: 87.1 mL/min (by C-G formula based on SCr of 0.88 mg/dL).   Medical History: Past Medical History:  Diagnosis Date  . Arthritis    "left knee; right shoulder" (04/19/2015)  . Atrial flutter [facility])    s/p ablation by Dr. Lovena Le  . BPH (benign prostatic hyperplasia)   . DCM (dilated cardiomyopathy) ([place]) 12/26/2016   EF 49% by cardiac MRI  . Diverticulosis of colon (without mention of hemorrhage)   . Habitual alcohol use   . HLD (hyperlipidemia)   . Hypertension   . Obesity   . OSA (obstructive sleep apnea) 04/27/2015   Mild to moderate with AHI 14.2/hr - refused CPAP  . Prostate cancer [facility])      Assessment: 61 YOM with history of Afib s/p ablation in 2016.  Patient reports he stopped taking Xarelto since.  Presented from PCP office with Afib with RVR and Pharmacy consulted to initiate IV heparin.  Baseline labs reviewed.   Goal of Therapy:  Heparin level 0.3-0.7 units/ml Monitor platelets by anticoagulation protocol: Yes    Plan:  Heparin 4500 units IV bolus, then Heparin gtt at 1350 units/hr Check 8 hr heparin level Daily heparin level and CBC   Thuy D. Mina Marble, PharmD, BCPS, [place] 07/19/2018, 1:58 PM   Per MD to stop heparin drip and resume Xarelto.   Jalene Mullet, PharmD PGY2 Hematology/ Oncology Pharmacy Resident 07/19/2018 3:38 PM
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
## 2018-07-19 NOTE — Progress Notes (Addendum)
CCMD called- reported pts HR dropped into the 30-40s.  Assessed patient - Asymptomatic: no SOB, chest pain or confusion/dizziness.  Apical rate of 56, BP was 110/76.   Per parameter in computer- stopped Cardizem drip and paged MD.     Per cardiology- discontinue cardizem for now and monitor HR.  Continue medication if HR increases again.  Passed on to night nurse.

## 2018-07-19 NOTE — ED Triage Notes (Addendum)
Pt states he has been having chest pain intermittently. Saw PCP. Was on prednisone but has finished it. Pt arrives today stating he woke up, felt his heart fluttering as the day progressed. Pt in aflutter with a rate of 160s.  Denies dizziness, shortness of breath, or chest pain currently. Pt has a hx of ablation, states he has been in a normal rhythm since then.

## 2018-07-20 ENCOUNTER — Encounter (HOSPITAL_COMMUNITY): Payer: Self-pay | Admitting: *Deleted

## 2018-07-20 ENCOUNTER — Inpatient Hospital Stay (HOSPITAL_COMMUNITY): Payer: Medicare Other

## 2018-07-20 ENCOUNTER — Inpatient Hospital Stay (HOSPITAL_COMMUNITY): Payer: Medicare Other | Admitting: Certified Registered Nurse Anesthetist

## 2018-07-20 ENCOUNTER — Encounter (HOSPITAL_COMMUNITY)
Admission: EM | Disposition: A | Discharge status: Home / Self Care | Payer: Self-pay | Source: Home / Self Care | Attending: Cardiology

## 2018-07-20 DIAGNOSIS — I5042 Chronic combined systolic (congestive) and diastolic (congestive) heart failure: Secondary | ICD-10-CM

## 2018-07-20 DIAGNOSIS — I4891 Unspecified atrial fibrillation: Secondary | ICD-10-CM

## 2018-07-20 DIAGNOSIS — I4892 Unspecified atrial flutter: Secondary | ICD-10-CM

## 2018-07-20 HISTORY — PX: TEE WITHOUT CARDIOVERSION: SHX5443

## 2018-07-20 HISTORY — PX: CARDIOVERSION: SHX1299

## 2018-07-20 LAB — BASIC METABOLIC PANEL
ANION GAP: 8 (ref 5–15)
BUN: 11 mg/dL (ref 8–23)
CALCIUM: 8.4 mg/dL — AB (ref 8.9–10.3)
CO2: 24 mmol/L (ref 22–32)
Chloride: 107 mmol/L (ref 98–111)
Creatinine, Ser: 0.87 mg/dL (ref 0.61–1.24)
GFR calc non Af Amer: >60 mL/min (ref >60)
GLUCOSE: 108 mg/dL — AB (ref 70–99)
Potassium: 3.9 mmol/L (ref 3.5–5.1)
Sodium: 139 mmol/L (ref 135–145)

## 2018-07-20 LAB — LIPID PANEL
Cholesterol: 168 mg/dL (ref 0–200)
HDL: 52 mg/dL (ref >40)
LDL Cholesterol: 97 mg/dL (ref 0–99)
TRIGLYCERIDES: 94 mg/dL (ref <150)
Total CHOL/HDL Ratio: 3.2 RATIO
VLDL: 19 mg/dL (ref 0–40)

## 2018-07-20 LAB — CBC
HEMATOCRIT: 44.5 % (ref 39.0–52.0)
Hemoglobin: 14.6 g/dL (ref 13.0–17.0)
MCH: 30.5 pg (ref 26.0–34.0)
MCHC: 32.8 g/dL (ref 30.0–36.0)
MCV: 92.9 fL (ref 78.0–100.0)
PLATELETS: 205 10*3/uL (ref 150–400)
RBC: 4.79 MIL/uL (ref 4.22–5.81)
RDW: 11.9 % (ref 11.5–15.5)
WBC: 6.7 10*3/uL (ref 4.0–10.5)

## 2018-07-20 LAB — TSH: TSH: 3.271 u[IU]/mL (ref 0.350–4.500)

## 2018-07-20 SURGERY — ECHOCARDIOGRAM, TRANSESOPHAGEAL
Anesthesia: General

## 2018-07-20 SURGERY — CARDIOVERSION
Anesthesia: General

## 2018-07-20 MED ORDER — PROPOFOL 500 MG/50ML IV EMUL
INTRAVENOUS | Status: DC | PRN
  Administered 2018-07-20: 100 ug/kg/min via INTRAVENOUS

## 2018-07-20 MED ORDER — SODIUM CHLORIDE 0.9 % IV SOLN: INTRAVENOUS | Status: DC

## 2018-07-20 MED ORDER — LIDOCAINE 2% (20 MG/ML) 5 ML SYRINGE
INTRAMUSCULAR | Status: DC | PRN
  Administered 2018-07-20: 100 mg via INTRAVENOUS

## 2018-07-20 MED ORDER — LACTATED RINGERS IV SOLN
INTRAVENOUS | Status: DC | PRN
  Administered 2018-07-20: 11:00:00 via INTRAVENOUS

## 2018-07-20 MED ORDER — PROPOFOL 10 MG/ML IV BOLUS
INTRAVENOUS | Status: DC | PRN
  Administered 2018-07-20: 40 mg via INTRAVENOUS
  Administered 2018-07-20: 30 mg via INTRAVENOUS
  Administered 2018-07-20: 10 mg via INTRAVENOUS

## 2018-07-20 NOTE — Interval H&P Note (Signed)
History and Physical Interval Note:  07/20/2018 10:30 AM  Travis Chase  has presented today for surgery, with the diagnosis of aflutter  The various methods of treatment have been discussed with the patient and family. After consideration of risks, benefits and other options for treatment, the patient has consented to  Procedure(s): TRANSESOPHAGEAL ECHOCARDIOGRAM (TEE) (N/A) CARDIOVERSION (N/A) as a surgical intervention .  The patient's history has been reviewed, patient examined, no change in status, stable for surgery.  I have reviewed the patient's chart and labs.  Questions were answered to the patient's satisfaction.     Dorris Carnes

## 2018-07-20 NOTE — Transfer of Care (Signed)
Immediate Anesthesia Transfer of Care Note  Patient: Travis Chase  Procedure(s) Performed: TRANSESOPHAGEAL ECHOCARDIOGRAM (TEE) (N/A ) CARDIOVERSION (N/A )  Patient Location: Endoscopy Unit  Anesthesia Type:MAC  Level of Consciousness: awake and alert   Airway & Oxygen Therapy: Patient Spontanous Breathing and Patient connected to nasal cannula oxygen  Post-op Assessment: Report given to RN and Post -op Vital signs reviewed and stable  Post vital signs: Reviewed and stable  Last Vitals:  Vitals Value Taken Time  BP 119/77 07/20/2018 11:22 AM  Temp 36.9 C 07/20/2018 11:20 AM  Pulse 74 07/20/2018 11:22 AM  Resp 14 07/20/2018 11:22 AM  SpO2 100 % 07/20/2018 11:22 AM  Vitals shown include unvalidated device data.  Last Pain:  Vitals:   07/20/18 1120  TempSrc: Oral  PainSc: 0-No pain         Complications: No apparent anesthesia complications

## 2018-07-20 NOTE — H&P (View-Only) (Signed)
Progress Note  Patient Name: Travis Chase Date of Encounter: 07/20/2018  Primary Cardiologist: Travis Him, MD  Primary EP: Dr. Lovena Chase  Subjective   Doing well this AM. Remains predominantly in rate controlled flutter, though on tele review appears he may also have occasional atrial fibrillation. Has received two doses of rivaroxaban in the hospital, was not taking as an outpatient. Denies shortness of breath. Did have slow rates overnight on dilitazem drip, which has been discontinued. He is amenable to TEE/CV this am and understands the need for continued anticoagulation post procedure. Denies active bleeding, no melena/hematochezia.  Inpatient Medications    Scheduled Meds: . Influenza vac split quadrivalent PF  0.5 mL Intramuscular Tomorrow-1000  . metoprolol tartrate  25 mg Oral BID  . pantoprazole  40 mg Oral Daily  . pneumococcal 23 valent vaccine  0.5 mL Intramuscular Tomorrow-1000  . rivaroxaban  20 mg Oral Q supper   Continuous Infusions: . diltiazem (CARDIZEM) infusion Stopped (07/19/18 1845)   PRN Meds: acetaminophen, ondansetron (ZOFRAN) IV   Vital Signs    Vitals:   07/19/18 1947 07/19/18 2317 07/19/18 2325 07/20/18 0531  BP: 113/87 107/87  (!) 119/92  Pulse: 78 77  75  Resp: 18  18 19   Temp: 98.1 F (36.7 C)  98.3 F (36.8 C) 98.1 F (36.7 C)  TempSrc: Oral  Oral Oral  SpO2: 98%  99% 98%  Weight:    97 kg  Height:        Intake/Output Summary (Last 24 hours) at 07/20/2018 0917 Last data filed at 07/20/2018 0600 Gross per 24 hour  Intake 487.7 ml  Output 1 ml  Net 486.7 ml   Filed Weights   07/19/18 1302 07/19/18 1616 07/20/18 0531  Weight: 96.6 kg 96.8 kg 97 kg    Telemetry    Rate controlled atrial flutter with periodic elevations in rate, occasional afib appearance- Personally Reviewed  ECG    Atrial flutter with 2:1 conduction - Personally Reviewed  Physical Exam   GEN: No acute distress.   Neck: supple, no JVD Cardiac: irregular  S1 and S2, no murmurs, rubs, or gallops.  Respiratory: Clear to auscultation bilaterally. GI: Soft, nontender, non-distended. Bowel sounds normal MS: No edema; No deformity. Neuro:  Nonfocal, moves all limbs independently Psych: Normal affect   Labs    Chemistry Recent Labs  Lab 07/19/18 1258 07/20/18 0632  NA 139 139  K 4.0 3.9  CL 106 107  CO2 25 24  GLUCOSE 102* 108*  BUN 12 11  CREATININE 0.88 0.87  CALCIUM 8.9 8.4*  GFRNONAA >60 >60  GFRAA >60 >60  ANIONGAP 8 8     Hematology Recent Labs  Lab 07/19/18 1258 07/20/18 0632  WBC 9.0 6.7  RBC 5.08 4.79  HGB 15.6 14.6  HCT 47.7 44.5  MCV 93.9 92.9  MCH 30.7 30.5  MCHC 32.7 32.8  RDW 11.6 11.9  PLT 210 205    Cardiac EnzymesNo results for input(s): TROPONINI in the last 168 hours.  Recent Labs  Lab 07/19/18 1313  TROPIPOC 0.04     BNPNo results for input(s): BNP, PROBNP in the last 168 hours.   DDimer No results for input(s): DDIMER in the last 168 hours.   Radiology    Dg Chest Portable 1 View  Result Date: 07/19/2018 CLINICAL DATA:  Tachycardia EXAM: PORTABLE CHEST 1 VIEW COMPARISON:  04/20/2015 FINDINGS: Atherosclerotic calcification of the aortic arch. Upper normal heart size. No edema. The lungs appear clear. No significant  bony abnormality observed. IMPRESSION: 1.  No active cardiopulmonary disease is radiographically apparent. 2.  Aortic Atherosclerosis (ICD10-I70.0). Electronically Signed   By: Travis Chase M.D.   On: 07/19/2018 13:38    Cardiac Studies   Echo 02/22/15: Study Conclusions  - Left ventricle: The cavity size was normal. Systolic function was mildly to moderately reduced. The estimated ejection fraction was in the range of 40% to 45%. Diffuse hypokinesis  Patient Profile     72 y.o. male with PMH aflutter s/p prior ablation who presented in aflutter with RVR yesterday.   Assessment & Plan    # Atrial flutter with RVR: -first since ablation in 2016. Was not on  anticoagulation prior to this admission (despite the ambulatory med list) -trigger likely recent prednisone use -controlled on dilt drip, then slow heart rates overnight. Currently on metoprolol 25 mg BID. -restarted rivaroxaban 20 mg daily, two doses thus far. Discussed w/Dr. Harrington Chase, ok to proceed with TEE/CV today -he understands need for anticoagulation for at least 4 weeks post procedure. Given Chadsvasc=3, will need to discuss long term anticoagulation as an outpatient.  CHA2DS2/VAS Stroke Risk Points     3  >= 2 Points: High Risk  1 - 1.99 Points: Medium Risk  0 Points: Low Risk      Points Metrics  1 Has Congestive Heart Failure:  Yes   0 Has Vascular Disease:  No   1 Has Hypertension:  Yes   1 Age:  58   0 Has Diabetes:  No   0 Had Stroke:  No  Had TIA:  No  Had thromboembolism:  No   0 Male:  No     -aware that alcohol, OSA, caffeine can all worsen afib/flutter recurrence  -TSH WNL  # Atypical chest pain: [name] chest pain sounds like GERD.  He has no exertional symptoms and is very active at baseline.  We will start PPI, especially given that he is resuming anticoagulation.  Stop aspirin.  Troponin negative.  # Hypertension: Blood pressure well-controlled.    # Chronic systolic and diastolic heart failure:  LVEF was 40 to 45% in 2016.  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  Repeat echocardiogram as above.  He is euvolemic.    Time Spent Directly with Patient: I have spent a total of >35 minutes with the patient reviewing hospital notes, telemetry, EKGs, labs and examining the patient as well as establishing an assessment and plan that was discussed personally with the patient.  > 50% of time was spent in direct patient care.  Length of Stay:  LOS: 1 day   Travis Dresser, MD, PhD [facility]  [facility]   07/20/2018, 9:17 AM   For questions or updates, please contact [place] Please consult [email or web address] for  contact info under Cardiology/STEMI.

## 2018-07-20 NOTE — Progress Notes (Signed)
Patient remain in NSR.

## 2018-07-20 NOTE — CV Procedure (Signed)
Cardioversion  Patient anesthetized with propofol by anesthesia  With pads in AP position, patient cardioverted to SR with 200 J synchronized biphasic energy PRocedure was without complication  12 lead EKG pending  Dorris Carnes

## 2018-07-20 NOTE — Progress Notes (Signed)
Progress Note  Patient Name: Travis Chase Date of Encounter: 07/20/2018  Primary Cardiologist: Fransico Him, MD  Primary EP: Dr. Lovena Le  Subjective   Doing well this AM. Remains predominantly in rate controlled flutter, though on tele review appears he may also have occasional atrial fibrillation. Has received two doses of rivaroxaban in the hospital, was not taking as an outpatient. Denies shortness of breath. Did have slow rates overnight on dilitazem drip, which has been discontinued. He is amenable to TEE/CV this am and understands the need for continued anticoagulation post procedure. Denies active bleeding, no melena/hematochezia.  Inpatient Medications    Scheduled Meds: . Influenza vac split quadrivalent PF  0.5 mL Intramuscular Tomorrow-1000  . metoprolol tartrate  25 mg Oral BID  . pantoprazole  40 mg Oral Daily  . pneumococcal 23 valent vaccine  0.5 mL Intramuscular Tomorrow-1000  . rivaroxaban  20 mg Oral Q supper   Continuous Infusions: . diltiazem (CARDIZEM) infusion Stopped (07/19/18 1845)   PRN Meds: acetaminophen, ondansetron (ZOFRAN) IV   Vital Signs    Vitals:   07/19/18 1947 07/19/18 2317 07/19/18 2325 07/20/18 0531  BP: 113/87 107/87  (!) 119/92  Pulse: 78 77  75  Resp: 18  18 19   Temp: 98.1 F (36.7 C)  98.3 F (36.8 C) 98.1 F (36.7 C)  TempSrc: Oral  Oral Oral  SpO2: 98%  99% 98%  Weight:    97 kg  Height:        Intake/Output Summary (Last 24 hours) at 07/20/2018 0917 Last data filed at 07/20/2018 0600 Gross per 24 hour  Intake 487.7 ml  Output 1 ml  Net 486.7 ml   Filed Weights   07/19/18 1302 07/19/18 1616 07/20/18 0531  Weight: 96.6 kg 96.8 kg 97 kg    Telemetry    Rate controlled atrial flutter with periodic elevations in rate, occasional afib appearance- Personally Reviewed  ECG    Atrial flutter with 2:1 conduction - Personally Reviewed  Physical Exam   GEN: No acute distress.   Neck: supple, no JVD Cardiac: irregular  S1 and S2, no murmurs, rubs, or gallops.  Respiratory: Clear to auscultation bilaterally. GI: Soft, nontender, non-distended. Bowel sounds normal MS: No edema; No deformity. Neuro:  Nonfocal, moves all limbs independently Psych: Normal affect   Labs    Chemistry Recent Labs  Lab 07/19/18 1258 07/20/18 0632  NA 139 139  K 4.0 3.9  CL 106 107  CO2 25 24  GLUCOSE 102* 108*  BUN 12 11  CREATININE 0.88 0.87  CALCIUM 8.9 8.4*  GFRNONAA >60 >60  GFRAA >60 >60  ANIONGAP 8 8     Hematology Recent Labs  Lab 07/19/18 1258 07/20/18 0632  WBC 9.0 6.7  RBC 5.08 4.79  HGB 15.6 14.6  HCT 47.7 44.5  MCV 93.9 92.9  MCH 30.7 30.5  MCHC 32.7 32.8  RDW 11.6 11.9  PLT 210 205    Cardiac EnzymesNo results for input(s): TROPONINI in the last 168 hours.  Recent Labs  Lab 07/19/18 1313  TROPIPOC 0.04     BNPNo results for input(s): BNP, PROBNP in the last 168 hours.   DDimer No results for input(s): DDIMER in the last 168 hours.   Radiology    Dg Chest Portable 1 View  Result Date: 07/19/2018 CLINICAL DATA:  Tachycardia EXAM: PORTABLE CHEST 1 VIEW COMPARISON:  04/20/2015 FINDINGS: Atherosclerotic calcification of the aortic arch. Upper normal heart size. No edema. The lungs appear clear. No significant  bony abnormality observed. IMPRESSION: 1.  No active cardiopulmonary disease is radiographically apparent. 2.  Aortic Atherosclerosis (ICD10-I70.0). Electronically Signed   By: Van Clines M.D.   On: 07/19/2018 13:38    Cardiac Studies   Echo 02/22/15: Study Conclusions  - Left ventricle: The cavity size was normal. Systolic function was mildly to moderately reduced. The estimated ejection fraction was in the range of 40% to 45%. Diffuse hypokinesis  Patient Profile     72 y.o. male with PMH aflutter s/p prior ablation who presented in aflutter with RVR yesterday.   Assessment & Plan    # Atrial flutter with RVR: -first since ablation in 2016. Was not on  anticoagulation prior to this admission (despite the ambulatory med list) -trigger likely recent prednisone use -controlled on dilt drip, then slow heart rates overnight. Currently on metoprolol 25 mg BID. -restarted rivaroxaban 20 mg daily, two doses thus far. Discussed w/Dr. Harrington Challenger, ok to proceed with TEE/CV today -he understands need for anticoagulation for at least 4 weeks post procedure. Given Chadsvasc=3, will need to discuss long term anticoagulation as an outpatient.  CHA2DS2/VAS Stroke Risk Points     3  >= 2 Points: High Risk  1 - 1.99 Points: Medium Risk  0 Points: Low Risk      Points Metrics  1 Has Congestive Heart Failure:  Yes   0 Has Vascular Disease:  No   1 Has Hypertension:  Yes   1 Age:  56   0 Has Diabetes:  No   0 Had Stroke:  No  Had TIA:  No  Had thromboembolism:  No   0 Male:  No     -aware that alcohol, OSA, caffeine can all worsen afib/flutter recurrence  -TSH WNL  # Atypical chest pain: Mr. Burley chest pain sounds like GERD.  He has no exertional symptoms and is very active at baseline.  We will start PPI, especially given that he is resuming anticoagulation.  Stop aspirin.  Troponin negative.  # Hypertension: Blood pressure well-controlled.    # Chronic systolic and diastolic heart failure:  LVEF was 40 to 45% in 2016.  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  Repeat echocardiogram as above.  He is euvolemic.    Time Spent Directly with Patient: I have spent a total of >35 minutes with the patient reviewing hospital notes, telemetry, EKGs, labs and examining the patient as well as establishing an assessment and plan that was discussed personally with the patient.  > 50% of time was spent in direct patient care.  Length of Stay:  LOS: 1 day   Buford Dresser, MD, PhD [facility]  [facility]   07/20/2018, 9:17 AM   For questions or updates, please contact [place] Please consult [email or web address] for  contact info under Cardiology/STEMI.

## 2018-07-20 NOTE — Progress Notes (Signed)
  Echocardiogram Echocardiogram Transesophageal has been performed.  Jannett Celestine 07/20/2018, 11:20 AM

## 2018-07-20 NOTE — Anesthesia Postprocedure Evaluation (Signed)
Anesthesia Post Note  Patient: Travis Chase  Procedure(s) Performed: TRANSESOPHAGEAL ECHOCARDIOGRAM (TEE) (N/A ) CARDIOVERSION (N/A )     Patient location during evaluation: PACU Anesthesia Type: General Level of consciousness: awake and alert Pain management: pain level controlled Vital Signs Assessment: post-procedure vital signs reviewed and stable Respiratory status: spontaneous breathing, nonlabored ventilation, respiratory function stable and patient connected to nasal cannula oxygen Cardiovascular status: blood pressure returned to baseline and stable Postop Assessment: no apparent nausea or vomiting Anesthetic complications: no    Last Vitals:  Vitals:   07/20/18 1151 07/20/18 1154  BP: (!) 131/91   Pulse: (!) 111 60  Resp: 20   Temp: 36.7 C   SpO2: 95%     Last Pain:  Vitals:   07/20/18 1151  TempSrc: Oral  PainSc:                  Barnet Glasgow

## 2018-07-20 NOTE — Discharge Instructions (Addendum)
Information on my medicine - XARELTO (Rivaroxaban)  Why was Xarelto prescribed for you? Xarelto was prescribed for you to reduce the risk of a blood clot forming that can cause a stroke if you have a medical condition called atrial fibrillation (a type of irregular heartbeat).  What do you need to know about xarelto ? Take your Xarelto ONCE DAILY at the same time every day with your evening meal. If you have difficulty swallowing the tablet whole, you may crush it and mix in applesauce just prior to taking your dose.  Take Xarelto exactly as prescribed by your doctor and DO NOT stop taking Xarelto without talking to the doctor who prescribed the medication.  Stopping without other stroke prevention medication to take the place of Xarelto may increase your risk of developing a clot that causes a stroke.  Refill your prescription before you run out.   After discharge, you should have regular check-up appointments with your healthcare provider that is prescribing your Xarelto.  In the future your dose may need to be changed if your kidney function or weight changes by a significant amount.  What do you do if you miss a dose? If you are taking Xarelto ONCE DAILY and you miss a dose, take it as soon as you remember on the same day then continue your regularly scheduled once daily regimen the next day. Do not take two doses of Xarelto at the same time or on the same day.   Important Safety Information A possible side effect of Xarelto is bleeding. You should call your healthcare provider right away if you experience any of the following: ? Bleeding from an injury or your nose that does not stop. ? Unusual colored urine (red or dark brown) or unusual colored stools (red or black). ? Unusual bruising for unknown reasons. ? A serious fall or if you hit your head (even if there is no bleeding).  Some medicines may interact with Xarelto and might increase your risk of bleeding while on  Xarelto. To help avoid this, consult your healthcare provider or pharmacist prior to using any new prescription or non-prescription medications, including herbals, vitamins, non-steroidal anti-inflammatory drugs (NSAIDs) and supplements.  This website has more information on Xarelto: [email or web address]    -----  Patients taking blood thinners should generally stay away from medicines like ibuprofen, Advil, Motrin, naproxen, and Aleve due to risk of stomach bleeding. You may take Tylenol as directed or talk to your primary doctor about alternatives.  ----

## 2018-07-20 NOTE — Anesthesia Preprocedure Evaluation (Addendum)
Anesthesia Evaluation  Patient identified by MRN, date of birth, ID band Patient awake    Reviewed: Allergy & Precautions, NPO status , Patient's Chart, lab work & pertinent test results, reviewed documented beta blocker date and time   Airway Mallampati: I  TM Distance: >3 FB Neck ROM: Full    Dental no notable dental hx. (+) Teeth Intact, Caps   Pulmonary sleep apnea and Continuous Positive Airway Pressure Ventilation , former smoker,    Pulmonary exam normal breath sounds clear to auscultation       Cardiovascular hypertension, Pt. on home beta blockers and Pt. on medications Normal cardiovascular exam+ dysrhythmias Atrial Fibrillation  Rhythm:Irregular Rate:Tachycardia  LVEF 40-45% diffuse hypokinesis   Neuro/Psych negative neurological ROS  negative psych ROS   GI/Hepatic (+)     substance abuse  alcohol use,   Endo/Other  Hyperlipidemia Obese  Renal/GU negative Renal ROS  negative genitourinary   Musculoskeletal  (+) Arthritis , Osteoarthritis,  Left sided sciatica   Abdominal (+) + obese,   Peds  Hematology Xarelto -last dose this am   Anesthesia Other Findings   Reproductive/Obstetrics                           Anesthesia Physical Anesthesia Plan  ASA: III  Anesthesia Plan: General   Post-op Pain Management:    Induction: Intravenous  PONV Risk Score and Plan: 2 and Ondansetron, Dexamethasone, Treatment may vary due to age or medical condition and Propofol infusion  Airway Management Planned: Mask  Additional Equipment:   Intra-op Plan:   Post-operative Plan:   Informed Consent: I have reviewed the patients History and Physical, chart, labs and discussed the procedure including the risks, benefits and alternatives for the proposed anesthesia with the patient or authorized representative who has indicated his/her understanding and acceptance.   Dental advisory  given  Plan Discussed with: CRNA and Surgeon  Anesthesia Plan Comments:         Anesthesia Quick Evaluation

## 2018-07-20 NOTE — CV Procedure (Signed)
TEE   Patient anesthetized with propofol by anesthesia  TEE probe advanced to mid esophagus without difficulty   LA, LAA without masses NO PFO as tested by color doppler only MV  Normal   Mild MR TV normal  MIld TR AV is mildly thickened and calcified  No AI PV normal  LVEF is depressed, probably moderately  Mild fixed plaquing in thoracic aorta   Full report to follow  Dorris Carnes

## 2018-07-21 ENCOUNTER — Encounter (HOSPITAL_COMMUNITY): Payer: Self-pay | Admitting: Internal Medicine

## 2018-07-21 ENCOUNTER — Ambulatory Visit: Payer: Medicare Other | Admitting: Physician Assistant

## 2018-07-21 LAB — CBC
HCT: 41.7 % (ref 39.0–52.0)
Hemoglobin: 13.6 g/dL (ref 13.0–17.0)
MCH: 30.6 pg (ref 26.0–34.0)
MCHC: 32.6 g/dL (ref 30.0–36.0)
MCV: 93.9 fL (ref 78.0–100.0)
PLATELETS: 193 10*3/uL (ref 150–400)
RBC: 4.44 MIL/uL (ref 4.22–5.81)
RDW: 11.9 % (ref 11.5–15.5)
WBC: 10 10*3/uL (ref 4.0–10.5)

## 2018-07-21 MED ORDER — METOPROLOL TARTRATE 25 MG PO TABS: 25.0000 mg | ORAL_TABLET | Freq: Two times a day (BID) | ORAL | 3 refills | Status: DC

## 2018-07-21 MED ORDER — PANTOPRAZOLE SODIUM 40 MG PO TBEC: 40.0000 mg | DELAYED_RELEASE_TABLET | Freq: Every day | ORAL | 1 refills | Status: DC

## 2018-07-21 NOTE — Discharge Summary (Signed)
Discharge Summary    Patient ID: Travis Chase MRN: [id number]; DOB: Mar 15, 1946  Admit date: 07/19/2018 Discharge date: 07/21/2018  Primary Care Provider: Jonathon Jordan, MD  Primary Cardiologist: Fransico Him, MD  Primary Electrophysiologist:  None   Discharge Diagnoses    Active Problems:   Atrial flutter [facility])   Atypical chest pain   Chronic combined systolic and diastolic heart failure (HCC)   Atrial fibrillation with RVR ([place])   Allergies No Known Allergies  Diagnostic Studies/Procedures    07/20/2018 TEE Study Conclusions - Left atrium: No evidence of thrombus in the atrial cavity or   appendage. No evidence of thrombus in the atrial cavity or   appendage. No evidence of thrombus in the atrial cavity or   appendage.   History of Present Illness     Travis Chase is a 72 y.o. male with chronic atrial fibrillation / flutter status post ablation 04/2015 for typical  Atrial flutter, chronic systolic and diastolic heart failure (cardiac MRI LVEF 49%, 2016 TTE 40-45%), hypertension, hyperlipidemia, OSA refusing CPAP, alcohol abuse, and obesity here with atrial fibrillation with rapid ventricular response.  He last saw Dr. Radford Pax 12/2016 and was reported to be doing well at that time and still in sinus rhythm. 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   Leading up to the hospitalization, patient reported he was very active and went to the [facility] several day per week and did work around his home and yard without any difficult or exertional cardiac symptoms. He had reportedly struggled with heartburn and noticed some chest pain and difficulty swallowing after eating certain foods, taking tums as needed.  He also reportedly drank several cups of coffee daily, unchanged from his baseline. He remained in NSR and was not on anticoagulation.   On 07/19/18, Mr. Breeze presented to the ED from his PCP's office after developing  worsening tachycardia, palpitations and chest pain with starting a course of prednisone for sciatica. At his MD's office, he was noted to be in Afib with RVR with HR in the 160s.  Hospital Course     In the [facility] ED, the patient was found to be in atrial flutter with ventricular rates in the 150s to 160s and started on a diltiazem drip. His EKG was negative for STE, and his troponin was negative.  He was admitted to [facility] for further management. During admission, it was noted that he remained predominantly in rate controlled flutter, though on telemetry review, it appeared he may also have occasional atrial fibrillation. CHA2DS2VASc score of at least  3 at that time (CHF, HTN, Age). TSH within normal limits. His rates remained controlled on the diltiazem drip; however, after experiencing slow rates on the dilitazem drip overnight from 9/8-9/9, this was stopped and metoprolol 25 mg bid daily started in its place. He also received two doses of rivaroxaban, which as stated above, he was not taking as an outpatient. TEE and cardioversion was discussed with the patient with his agreement to undergo this procedure. He expressed understanding regarding the need for continued anticoagulation post procedure, and he denied active bleeding, melena/hematochezia leading up to the procedure. The procedure was discussed with Dr. Harrington Challenger, and the patient deemed OK to proceed with TEE / CV on 9/9.  On 07/20/2018, he underwent TEE and cardioversion with successful conversion to NSR and has remained in sinus during the admission.   Today 07/21/18, the patient remains stable and has  been examined and is euvolemic and without symptoms on exam and ready for discharge. He is aware that alcohol, OSA, and caffeine can all worsen Afib/flutter recurrences. He expressed understanding regarding the importance of anticoagulation for stroke procedure. Of note, his reported chest pain is suspicious for GERD as he had no  exertional symptoms and is very active at baseline, so he was also started on a PPI given that he will be resuming anticoagulation. He was instructed to stop ASA. A free 30 day prescription will be provided for the patient.  _____________  Discharge Vitals Blood pressure 124/84, pulse 76, temperature 98.2 F (36.8 C), temperature source Oral, resp. rate 18, height 5\' 10"  (1.778 m), weight 97.7 kg, SpO2 99 %.  Filed Weights   07/19/18 1616 07/20/18 0531 07/21/18 0519  Weight: 96.8 kg 97 kg 97.7 kg    Labs & Radiologic Studies    CBC Recent Labs    07/20/18 0632 07/21/18 0808  WBC 6.7 10.0  HGB 14.6 13.6  HCT 44.5 41.7  MCV 92.9 93.9  PLT 205 782   Basic Metabolic Panel Recent Labs    07/19/18 1258 07/20/18 0632  NA 139 139  K 4.0 3.9  CL 106 107  CO2 25 24  GLUCOSE 102* 108*  BUN 12 11  CREATININE 0.88 0.87  CALCIUM 8.9 8.4*  MG 2.2  --    Fasting Lipid Panel Recent Labs    07/20/18 0632  CHOL 168  HDL 52  LDLCALC 97  TRIG 94  CHOLHDL 3.2   Thyroid Function Tests Recent Labs    07/20/18 0632  TSH 3.271   _____________  Dg Chest Portable 1 View  Result Date: 07/19/2018 CLINICAL DATA:  Tachycardia EXAM: PORTABLE CHEST 1 VIEW COMPARISON:  04/20/2015 FINDINGS: Atherosclerotic calcification of the aortic arch. Upper normal heart size. No edema. The lungs appear clear. No significant bony abnormality observed. IMPRESSION: 1.  No active cardiopulmonary disease is radiographically apparent. 2.  Aortic Atherosclerosis (ICD10-I70.0). Electronically Signed   By: Van Clines M.D.   On: 07/19/2018 13:38   PHYSICAL EXAM: GEN: Well nourished, well developed, in no acute distress. Sitting on edge of bed. HEENT: normal Neck: no JVD, carotid bruits, or masses Cardiac: RRR; no murmurs, rubs, or gallops. No clubbing, cyanosis, edema.  Radials/DP/PT 2+ and equal bilaterally.  Respiratory:  clear to auscultation bilaterally, normal work of breathing GI: soft,  nontender, nondistended MS: no deformity or atrophy. No edema. Skin: warm and dry, no rash Neuro:  Strength and sensation are intact Psych: euthymic mood, full affect - ready for discharge.  Disposition   Pt is being discharged home today in good condition.  Follow-up Plans & Appointments    Follow-up Information     [facility] Follow up.   Specialty:  Cardiology Why:  You have a follow-up appointment at the [facility] Please call [phone] if you do not receive information regarding your appointment date and time within the week. Contact information: [address] [id number] [place] [phone]         Discharge Instructions    (HEART FAILURE PATIENTS) Call MD:  Anytime you have any of the following symptoms: 1) 3 pound weight gain in 24 hours or 5 pounds in 1 week 2) shortness of breath, with or without a dry hacking cough 3) swelling in the hands, feet or stomach 4) if you have to sleep on extra pillows at night in order to breathe.   Complete  by:  As directed    Amb referral to [facility] Clinic   Complete by:  As directed    Call MD for:  difficulty breathing, headache or visual disturbances   Complete by:  As directed    Call MD for:  extreme fatigue   Complete by:  As directed    Call MD for:  persistant dizziness or light-headedness   Complete by:  As directed    Call MD for:  persistant nausea and vomiting   Complete by:  As directed    Call MD for:  severe uncontrolled pain   Complete by:  As directed    Diet - low sodium heart healthy   Complete by:  As directed    Increase activity slowly   Complete by:  As directed       Discharge Medications   Allergies as of 07/21/2018   No Known Allergies     Medication List    STOP taking these medications   aspirin EC 81 MG tablet   ibuprofen 200 MG tablet Commonly known as:  ADVIL,MOTRIN   predniSONE 10 MG tablet Commonly known as:   DELTASONE     TAKE these medications   Fish Oil 1000 MG Caps Take 1 capsule by mouth daily.   GLUCOSAMINE CHONDR 1500 COMPLX PO Take 2 tablets by mouth 2 (two) times daily.   metoprolol tartrate 25 MG tablet Commonly known as:  LOPRESSOR Take 1 tablet (25 mg total) by mouth 2 (two) times daily. What changed:    medication strength  how much to take  additional instructions  Another medication with the same name was removed. Continue taking this medication, and follow the directions you see here.   pantoprazole 40 MG tablet Commonly known as:  PROTONIX Take 1 tablet (40 mg total) by mouth daily. Start taking on:  07/22/2018   rivaroxaban 20 MG Tabs tablet Commonly known as:  XARELTO Take 1 tablet (20 mg total) by mouth daily with supper.        Acute coronary syndrome (MI, NSTEMI, STEMI, etc) this admission?: No.    Outstanding Labs/Studies   No outstanding labs. Follow-up with Afib clinic.   Duration of Discharge Encounter   Greater than 30 minutes including physician time.  Signed, Arvil Chaco, PA-C 07/21/2018, 10:29 AM

## 2018-07-21 NOTE — Progress Notes (Signed)
Patient waiting for wife to pick him up.

## 2018-07-21 NOTE — Progress Notes (Signed)
Patient ready for discharge. 

## 2018-07-21 NOTE — Care Management (Signed)
07-21-18 BENEFITS  CHECK :   #  8.  S/W AMY @ [facility] # [phone]   XARELTO   20 MG   DAILY COVER- YES CO-PAY- $ 416.14 TIER- 3 DRUG PRIOR APPROVAL- NO  DEDUCTIBLE - NOT MET  PREFERRED PHARMACY : YES   [facility]

## 2018-07-21 NOTE — Progress Notes (Addendum)
Benefit check for Travis Chase in progressAneta Mins [phone]  14:06 pm- Patient left prior to receiving his Xarelto coupon card; a copy of the Xarelto coupon card faxed to [facility] for the pharmacist to use when the patient picks up his medication. Mindi Slicker RN,MHA,BSN

## 2018-07-26 ENCOUNTER — Emergency Department (HOSPITAL_COMMUNITY)
Admission: EM | Admit: 2018-07-26 | Discharge: 2018-07-26 | Disposition: A | Discharge status: Home / Self Care | Payer: Medicare Other | Attending: Emergency Medicine | Admitting: Emergency Medicine

## 2018-07-26 ENCOUNTER — Encounter (HOSPITAL_COMMUNITY): Payer: Self-pay | Admitting: Emergency Medicine

## 2018-07-26 ENCOUNTER — Other Ambulatory Visit: Payer: Self-pay

## 2018-07-26 DIAGNOSIS — Z87891 Personal history of nicotine dependence: Secondary | ICD-10-CM | POA: Diagnosis not present

## 2018-07-26 DIAGNOSIS — I4891 Unspecified atrial fibrillation: Secondary | ICD-10-CM | POA: Diagnosis not present

## 2018-07-26 DIAGNOSIS — Z79899 Other long term (current) drug therapy: Secondary | ICD-10-CM | POA: Insufficient documentation

## 2018-07-26 DIAGNOSIS — I10 Essential (primary) hypertension: Secondary | ICD-10-CM | POA: Diagnosis not present

## 2018-07-26 HISTORY — DX: Unspecified atrial fibrillation: I48.91

## 2018-07-26 LAB — CBC
HCT: 47.6 % (ref 39.0–52.0)
HEMOGLOBIN: 16 g/dL (ref 13.0–17.0)
MCH: 30.8 pg (ref 26.0–34.0)
MCHC: 33.6 g/dL (ref 30.0–36.0)
MCV: 91.5 fL (ref 78.0–100.0)
Platelets: 315 10*3/uL (ref 150–400)
RBC: 5.2 MIL/uL (ref 4.22–5.81)
RDW: 11.8 % (ref 11.5–15.5)
WBC: 7.8 10*3/uL (ref 4.0–10.5)

## 2018-07-26 LAB — BASIC METABOLIC PANEL
ANION GAP: 9 (ref 5–15)
BUN: 14 mg/dL (ref 8–23)
CALCIUM: 9.5 mg/dL (ref 8.9–10.3)
CO2: 26 mmol/L (ref 22–32)
Chloride: 105 mmol/L (ref 98–111)
Creatinine, Ser: 0.82 mg/dL (ref 0.61–1.24)
GFR calc Af Amer: >60 mL/min (ref >60)
GFR calc non Af Amer: >60 mL/min (ref >60)
GLUCOSE: 103 mg/dL — AB (ref 70–99)
Potassium: 4.6 mmol/L (ref 3.5–5.1)
Sodium: 140 mmol/L (ref 135–145)

## 2018-07-26 LAB — PROTIME-INR
INR: 1.19
PROTHROMBIN TIME: 15 s (ref 11.4–15.2)

## 2018-07-26 LAB — TROPONIN I

## 2018-07-26 MED ORDER — DILTIAZEM HCL-DEXTROSE 100-5 MG/100ML-% IV SOLN (PREMIX)
5.0000 mg/h | INTRAVENOUS | Status: DC
  Administered 2018-07-26: 5 mg/h via INTRAVENOUS
  Filled 2018-07-26: qty 100

## 2018-07-26 MED ORDER — DILTIAZEM LOAD VIA INFUSION
20.0000 mg | Freq: Once | INTRAVENOUS | Status: AC
  Administered 2018-07-26: 20 mg via INTRAVENOUS
  Filled 2018-07-26: qty 20

## 2018-07-26 MED ORDER — METOPROLOL TARTRATE 25 MG PO TABS
25.0000 mg | ORAL_TABLET | Freq: Once | ORAL | Status: DC
  Filled 2018-07-26: qty 1
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
## 2018-07-26 NOTE — ED Triage Notes (Signed)
Pt reports he came to ED last Sunday for cp/palpitations, was diagnosed with afib/aflutter, had TEE and cardioversion on Monday, states that today he began feeling "funny" and could tell his heart was out of rhythm again, denies cp, sob or palpitations. Pt a/ox4, resp e/u, nad. Pt currently on xarelto, HR 160s in triage.

## 2018-07-28 ENCOUNTER — Encounter (HOSPITAL_COMMUNITY): Payer: Self-pay | Admitting: Nurse Practitioner

## 2018-07-28 ENCOUNTER — Ambulatory Visit (HOSPITAL_COMMUNITY)
Admission: RE | Admit: 2018-07-28 | Discharge: 2018-07-28 | Disposition: A | Discharge status: Home / Self Care | Payer: Medicare Other | Source: Ambulatory Visit | Attending: Nurse Practitioner | Admitting: Nurse Practitioner

## 2018-07-28 VITALS — BP 118/64 | HR 127 | Ht 70.0 in | Wt 213.0 lb

## 2018-07-28 DIAGNOSIS — I480 Paroxysmal atrial fibrillation: Secondary | ICD-10-CM | POA: Diagnosis not present

## 2018-07-28 DIAGNOSIS — R9431 Abnormal electrocardiogram [ECG] [EKG]: Secondary | ICD-10-CM | POA: Insufficient documentation

## 2018-07-28 DIAGNOSIS — Z9889 Other specified postprocedural states: Secondary | ICD-10-CM | POA: Insufficient documentation

## 2018-07-28 DIAGNOSIS — F1010 Alcohol abuse, uncomplicated: Secondary | ICD-10-CM | POA: Diagnosis not present

## 2018-07-28 DIAGNOSIS — Z8249 Family history of ischemic heart disease and other diseases of the circulatory system: Secondary | ICD-10-CM | POA: Diagnosis not present

## 2018-07-28 DIAGNOSIS — I10 Essential (primary) hypertension: Secondary | ICD-10-CM | POA: Insufficient documentation

## 2018-07-28 DIAGNOSIS — Z79899 Other long term (current) drug therapy: Secondary | ICD-10-CM | POA: Diagnosis not present

## 2018-07-28 DIAGNOSIS — E669 Obesity, unspecified: Secondary | ICD-10-CM | POA: Insufficient documentation

## 2018-07-28 DIAGNOSIS — Z7901 Long term (current) use of anticoagulants: Secondary | ICD-10-CM | POA: Diagnosis not present

## 2018-07-28 DIAGNOSIS — E785 Hyperlipidemia, unspecified: Secondary | ICD-10-CM | POA: Diagnosis not present

## 2018-07-28 DIAGNOSIS — N400 Enlarged prostate without lower urinary tract symptoms: Secondary | ICD-10-CM | POA: Insufficient documentation

## 2018-07-28 DIAGNOSIS — Z8546 Personal history of malignant neoplasm of prostate: Secondary | ICD-10-CM | POA: Diagnosis not present

## 2018-07-28 DIAGNOSIS — I4892 Unspecified atrial flutter: Secondary | ICD-10-CM | POA: Diagnosis not present

## 2018-07-28 DIAGNOSIS — G4733 Obstructive sleep apnea (adult) (pediatric): Secondary | ICD-10-CM | POA: Insufficient documentation

## 2018-07-28 DIAGNOSIS — Z87891 Personal history of nicotine dependence: Secondary | ICD-10-CM | POA: Insufficient documentation

## 2018-07-28 DIAGNOSIS — I420 Dilated cardiomyopathy: Secondary | ICD-10-CM | POA: Insufficient documentation

## 2018-07-28 MED ORDER — METOPROLOL TARTRATE 25 MG PO TABS: 37.5000 mg | ORAL_TABLET | Freq: Two times a day (BID) | ORAL | 3 refills | Status: DC

## 2018-07-28 NOTE — Patient Instructions (Signed)
Increase metoprolol to 37.5mg  twice a day (1 and 1/2 tabs twice a day)
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
## 2018-07-31 ENCOUNTER — Encounter (HOSPITAL_COMMUNITY): Payer: Self-pay | Admitting: Nurse Practitioner

## 2018-07-31 ENCOUNTER — Ambulatory Visit (HOSPITAL_COMMUNITY)
Admission: RE | Admit: 2018-07-31 | Discharge: 2018-07-31 | Disposition: A | Discharge status: Home / Self Care | Payer: Medicare Other | Source: Ambulatory Visit | Attending: Nurse Practitioner | Admitting: Nurse Practitioner

## 2018-07-31 DIAGNOSIS — I4892 Unspecified atrial flutter: Secondary | ICD-10-CM | POA: Insufficient documentation

## 2018-07-31 DIAGNOSIS — I4891 Unspecified atrial fibrillation: Secondary | ICD-10-CM | POA: Diagnosis not present

## 2018-07-31 NOTE — Progress Notes (Signed)
Pt in is the afib clinic for f/u with rapid atrial flutter earlier in week of 127 bpm . BB was added and his v rate is much better today, 82 bpm.  BP 136/88.He is pending f/u with Dr. Lovena Le 9/26 to consider if a repeat  aflutter ablation  is indicated.

## 2018-08-03 ENCOUNTER — Telehealth: Payer: Self-pay | Admitting: *Deleted

## 2018-08-03 DIAGNOSIS — G4733 Obstructive sleep apnea (adult) (pediatric): Secondary | ICD-10-CM

## 2018-08-03 NOTE — Addendum Note (Signed)
Encounter addended by: Sherran Needs, NP on: 08/03/2018 9:05 AM  Actions taken: Sign clinical note

## 2018-08-03 NOTE — Telephone Encounter (Signed)
PSG sent to precert

## 2018-08-03 NOTE — Telephone Encounter (Signed)
-----   Message from Juluis Mire, RN sent at 08/03/2018  9:05 AM EDT ----- Regarding: RE: cpap Ok she updated. ----- Message ----- From: Freada Bergeron, CMA Sent: 07/31/2018  10:32 AM EDT To: Juluis Mire, RN Subject: RE: cpap                                       Hello, yes I can help you with this but ask Butch Penny to addend her note to mention his symptoms and that she is ordering him a sleep study so that will serve as his face to face office visit. Thanks, Gershon Cull, CMA Sleep Coordinator ----- Message ----- From: Juluis Mire, RN Sent: 07/28/2018   3:46 PM EDT To: Freada Bergeron, CMA Subject: cpap                                           Pt had sleep study back in 2016 - saw today and is willing to try cpap now to treat his sleep apnea. Can you help him with this? thanks stacy

## 2018-08-05 ENCOUNTER — Ambulatory Visit (HOSPITAL_COMMUNITY): Payer: Medicare Other | Admitting: Nurse Practitioner

## 2018-08-05 ENCOUNTER — Other Ambulatory Visit: Payer: Self-pay | Admitting: Nurse Practitioner

## 2018-08-05 DIAGNOSIS — I5042 Chronic combined systolic (congestive) and diastolic (congestive) heart failure: Secondary | ICD-10-CM

## 2018-08-05 DIAGNOSIS — G4733 Obstructive sleep apnea (adult) (pediatric): Secondary | ICD-10-CM

## 2018-08-05 DIAGNOSIS — I10 Essential (primary) hypertension: Secondary | ICD-10-CM

## 2018-08-05 DIAGNOSIS — I483 Typical atrial flutter: Secondary | ICD-10-CM

## 2018-08-06 ENCOUNTER — Institutional Professional Consult (permissible substitution): Payer: Medicare Other | Admitting: Internal Medicine

## 2018-08-07 ENCOUNTER — Encounter: Payer: Self-pay | Admitting: Internal Medicine

## 2018-08-21 ENCOUNTER — Encounter: Payer: Self-pay | Admitting: Internal Medicine

## 2018-08-21 ENCOUNTER — Ambulatory Visit: Payer: Medicare Other | Admitting: Internal Medicine

## 2018-08-21 VITALS — BP 112/70 | HR 159 | Ht 70.0 in | Wt 215.0 lb

## 2018-08-21 DIAGNOSIS — I4892 Unspecified atrial flutter: Secondary | ICD-10-CM | POA: Diagnosis not present

## 2018-08-21 MED ORDER — WARFARIN SODIUM 5 MG PO TABS: 5.0000 mg | ORAL_TABLET | Freq: Every day | ORAL | 3 refills | Status: DC

## 2018-08-21 MED ORDER — AMIODARONE HCL 200 MG PO TABS: 200.0000 mg | ORAL_TABLET | Freq: Two times a day (BID) | ORAL | 11 refills | Status: DC
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
## 2018-08-21 NOTE — Patient Instructions (Addendum)
Medication Instructions:  Your physician has recommended you make the following change in your medication:   1.  Start taking amiodarone 200 mg--Take one tablet by mouth TWICE a day.  2.  Start taking warfarin 5 mg---Take one tablet by mouth every evening.  START this Saturday 08/22/2018.    3.  Continue taking your Xarelto 20 mg-  Take your LAST dose of Xarelto on October 14 your PM dose.  You will not take any more Xarelto starting October 15.  Labwork: None ordered.  Testing/Procedures: None ordered.  Follow-Up:  Schedule with coumadin clinic at [facility] in one week for new warfarin start.  Your physician wants you to follow-up in: 4 weeks with Dr. Lovena Le.    Any Other Special Instructions Will Be Listed Below (If Applicable).  If you need a refill on your cardiac medications before your next appointment, please call your pharmacy.

## 2018-08-28 ENCOUNTER — Ambulatory Visit: Payer: Medicare Other | Admitting: *Deleted

## 2018-08-28 DIAGNOSIS — Z5181 Encounter for therapeutic drug level monitoring: Secondary | ICD-10-CM | POA: Diagnosis not present

## 2018-08-28 DIAGNOSIS — I483 Typical atrial flutter: Secondary | ICD-10-CM | POA: Diagnosis not present

## 2018-08-28 LAB — POCT INR: INR: 1.6 — AB (ref 2.0–3.0)

## 2018-08-28 NOTE — Patient Instructions (Addendum)
A full discussion of the nature of anticoagulants has been carried out.  A benefit risk analysis has been presented to the patient, so that they understand the justification for choosing anticoagulation at this time. The need for frequent and regular monitoring, precise dosage adjustment and compliance is stressed.  Side effects of potential bleeding are discussed.  The patient should avoid any OTC items containing aspirin or ibuprofen, and should avoid great swings in general diet.  Avoid alcohol consumption.  Call if any signs of abnormal bleeding.  You will have weekly visits until INR stable in your goal range then appts will be lengthened weekly.  Description   Today take 1.5 tablets then start taking 1 tablet daily except 1.5 tablets on Tuesdays. Recheck INR in 1 week. Please call the Coumadin Clinic with any Coumadin related issues and with new medications/changes and procedures/surgeries: [facility] Clinic [phone] Main [phone]

## 2018-09-03 ENCOUNTER — Ambulatory Visit: Payer: Medicare Other | Admitting: *Deleted

## 2018-09-03 DIAGNOSIS — Z5181 Encounter for therapeutic drug level monitoring: Secondary | ICD-10-CM

## 2018-09-03 DIAGNOSIS — I483 Typical atrial flutter: Secondary | ICD-10-CM | POA: Diagnosis not present

## 2018-09-03 LAB — POCT INR: INR: 4.3 — AB (ref 2.0–3.0)

## 2018-09-03 NOTE — Patient Instructions (Signed)
Description   Do not take any Coumadin today and take 1/2 tablet tomorrow then start taking 1 tablet daily. Recheck INR in 1 week. Please call the Coumadin Clinic with any Coumadin related issues and with new medications/changes and procedures/surgeries: [facility] [place] [phone]

## 2018-09-08 ENCOUNTER — Ambulatory Visit (INDEPENDENT_AMBULATORY_CARE_PROVIDER_SITE_OTHER): Payer: Medicare Other | Admitting: Pharmacist

## 2018-09-08 DIAGNOSIS — I483 Typical atrial flutter: Secondary | ICD-10-CM

## 2018-09-08 DIAGNOSIS — Z5181 Encounter for therapeutic drug level monitoring: Secondary | ICD-10-CM

## 2018-09-08 LAB — POCT INR: INR: 3.1 — AB (ref 2.0–3.0)

## 2018-09-08 NOTE — Patient Instructions (Signed)
Description   Take only 1/2 tablet today then start taking 1 tablet daily, except 1/2 tablet on Fridays. Recheck INR in 1 week. Please call the Coumadin Clinic with any Coumadin related issues and with new medications/changes and procedures/surgeries: [facility] [place] [phone]

## 2018-09-15 ENCOUNTER — Encounter: Payer: Self-pay | Admitting: *Deleted

## 2018-09-15 ENCOUNTER — Ambulatory Visit (INDEPENDENT_AMBULATORY_CARE_PROVIDER_SITE_OTHER): Payer: Medicare Other | Admitting: *Deleted

## 2018-09-15 ENCOUNTER — Ambulatory Visit: Payer: Medicare Other | Admitting: Internal Medicine

## 2018-09-15 ENCOUNTER — Encounter: Payer: Self-pay | Admitting: Internal Medicine

## 2018-09-15 VITALS — BP 122/78 | HR 88 | Ht 70.0 in | Wt 219.0 lb

## 2018-09-15 DIAGNOSIS — I483 Typical atrial flutter: Secondary | ICD-10-CM

## 2018-09-15 DIAGNOSIS — I4892 Unspecified atrial flutter: Secondary | ICD-10-CM | POA: Diagnosis not present

## 2018-09-15 DIAGNOSIS — Z5181 Encounter for therapeutic drug level monitoring: Secondary | ICD-10-CM

## 2018-09-15 LAB — POCT INR: INR: 2.9 (ref 2.0–3.0)
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
## 2018-09-15 NOTE — Patient Instructions (Addendum)
Your physician recommends that you continue on your current medications as directed. Please refer to the Current Medication list given to you today.  Your physician recommends that you return for lab work in: today (cbc, bmet) Your physician recommends that you schedule a follow-up appointment in: 6 weeks with Dr. Lovena Le.   Your physician has recommended that you have a Cardioversion (DCCV). Electrical Cardioversion uses a jolt of electricity to your heart either through paddles or wired patches attached to your chest. This is a controlled, usually prescheduled, procedure. Defibrillation is done under light anesthesia in the hospital, and you usually go home the day of the procedure. This is done to get your heart back into a normal rhythm. You are not awake for the procedure. Please see the instruction sheet given to you today.  We will call you later today to arrange cardioversion for this Thursday, 09/17/18, if your INR is acceptable today. ____________________________________________________ Addendum:  Per Dr. Lovena Le pt has had only 2 weeks of therapeutic INR so will need TEE prior to DCCV.  This has been scheduled with Dr. Aundra Dubin for 11:00 am on Thurs 09/17/18.
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
## 2018-09-15 NOTE — Patient Instructions (Signed)
Description   Continue taking 1 tablet daily except 1/2 tablet on Fridays. Recheck INR in 1 week. Please call the Coumadin Clinic with any Coumadin related issues and with new medications/changes and procedures/surgeries: [place] [facility] [phone] [place] Main [phone]     

## 2018-09-15 NOTE — Addendum Note (Signed)
Addended by: Rodman Key on: 09/15/2018 01:50 PM   Modules accepted: Orders

## 2018-09-16 LAB — BASIC METABOLIC PANEL
BUN / CREAT RATIO: 15 (ref 10–24)
BUN: 14 mg/dL (ref 8–27)
CALCIUM: 9.2 mg/dL (ref 8.6–10.2)
CHLORIDE: 101 mmol/L (ref 96–106)
CO2: 24 mmol/L (ref 20–29)
Creatinine, Ser: 0.91 mg/dL (ref 0.76–1.27)
GFR calc Af Amer: 96 mL/min/{1.73_m2} (ref >59)
GFR calc non Af Amer: 83 mL/min/{1.73_m2} (ref >59)
Glucose: 90 mg/dL (ref 65–99)
Potassium: 4.5 mmol/L (ref 3.5–5.2)
Sodium: 139 mmol/L (ref 134–144)

## 2018-09-16 LAB — CBC
HEMATOCRIT: 47.1 % (ref 37.5–51.0)
HEMOGLOBIN: 15.3 g/dL (ref 13.0–17.7)
MCH: 29.3 pg (ref 26.6–33.0)
MCHC: 32.5 g/dL (ref 31.5–35.7)
MCV: 90 fL (ref 79–97)
Platelets: 230 10*3/uL (ref 150–450)
RBC: 5.22 x10E6/uL (ref 4.14–5.80)
RDW: 11.8 % — AB (ref 12.3–15.4)
WBC: 7.1 10*3/uL (ref 3.4–10.8)

## 2018-09-16 NOTE — Progress Notes (Signed)
Several unsuccessful attempts were made to contact pt. Left voice message with pre-op instructions according to pre-op checklist; please complete assessment on DOS. Nurse also left a voice message on the Endoscopy voice mailbox making staff aware of the following. Pt made aware to stop taking vitamins, Glucosamine and herbal medications. Pt made aware to follow surgeon's instructions if they differ from pre-op instructions left on voice mailbox.

## 2018-09-17 ENCOUNTER — Encounter (HOSPITAL_COMMUNITY)
Admission: RE | Disposition: A | Discharge status: Home / Self Care | Payer: Self-pay | Source: Ambulatory Visit | Attending: Cardiology

## 2018-09-17 ENCOUNTER — Ambulatory Visit (HOSPITAL_COMMUNITY): Payer: Medicare Other | Admitting: Certified Registered"

## 2018-09-17 ENCOUNTER — Ambulatory Visit (HOSPITAL_COMMUNITY)
Admission: RE | Admit: 2018-09-17 | Discharge: 2018-09-17 | Disposition: A | Discharge status: Home / Self Care | Payer: Medicare Other | Source: Ambulatory Visit | Attending: Cardiology | Admitting: Cardiology

## 2018-09-17 ENCOUNTER — Ambulatory Visit (HOSPITAL_BASED_OUTPATIENT_CLINIC_OR_DEPARTMENT_OTHER)
Admission: RE | Admit: 2018-09-17 | Discharge: 2018-09-17 | Disposition: A | Discharge status: Home / Self Care | Payer: Medicare Other | Source: Ambulatory Visit | Attending: Cardiology | Admitting: Cardiology

## 2018-09-17 ENCOUNTER — Other Ambulatory Visit: Payer: Self-pay

## 2018-09-17 ENCOUNTER — Encounter (HOSPITAL_COMMUNITY): Payer: Self-pay

## 2018-09-17 DIAGNOSIS — Z87891 Personal history of nicotine dependence: Secondary | ICD-10-CM | POA: Insufficient documentation

## 2018-09-17 DIAGNOSIS — I484 Atypical atrial flutter: Secondary | ICD-10-CM | POA: Diagnosis not present

## 2018-09-17 DIAGNOSIS — N400 Enlarged prostate without lower urinary tract symptoms: Secondary | ICD-10-CM | POA: Insufficient documentation

## 2018-09-17 DIAGNOSIS — Z7901 Long term (current) use of anticoagulants: Secondary | ICD-10-CM | POA: Diagnosis not present

## 2018-09-17 DIAGNOSIS — M1712 Unilateral primary osteoarthritis, left knee: Secondary | ICD-10-CM | POA: Diagnosis not present

## 2018-09-17 DIAGNOSIS — M19011 Primary osteoarthritis, right shoulder: Secondary | ICD-10-CM | POA: Diagnosis not present

## 2018-09-17 DIAGNOSIS — I4891 Unspecified atrial fibrillation: Secondary | ICD-10-CM | POA: Insufficient documentation

## 2018-09-17 DIAGNOSIS — E785 Hyperlipidemia, unspecified: Secondary | ICD-10-CM | POA: Insufficient documentation

## 2018-09-17 DIAGNOSIS — Z6831 Body mass index (BMI) 31.0-31.9, adult: Secondary | ICD-10-CM | POA: Insufficient documentation

## 2018-09-17 DIAGNOSIS — I10 Essential (primary) hypertension: Secondary | ICD-10-CM | POA: Diagnosis not present

## 2018-09-17 DIAGNOSIS — G4733 Obstructive sleep apnea (adult) (pediatric): Secondary | ICD-10-CM | POA: Diagnosis not present

## 2018-09-17 DIAGNOSIS — I083 Combined rheumatic disorders of mitral, aortic and tricuspid valves: Secondary | ICD-10-CM | POA: Diagnosis not present

## 2018-09-17 DIAGNOSIS — I420 Dilated cardiomyopathy: Secondary | ICD-10-CM | POA: Insufficient documentation

## 2018-09-17 DIAGNOSIS — E669 Obesity, unspecified: Secondary | ICD-10-CM | POA: Diagnosis not present

## 2018-09-17 DIAGNOSIS — I4892 Unspecified atrial flutter: Secondary | ICD-10-CM | POA: Diagnosis not present

## 2018-09-17 DIAGNOSIS — I371 Nonrheumatic pulmonary valve insufficiency: Secondary | ICD-10-CM | POA: Diagnosis not present

## 2018-09-17 HISTORY — PX: CARDIOVERSION: SHX1299

## 2018-09-17 HISTORY — PX: TEE WITHOUT CARDIOVERSION: SHX5443

## 2018-09-17 SURGERY — ECHOCARDIOGRAM, TRANSESOPHAGEAL
Anesthesia: General

## 2018-09-17 MED ORDER — PROPOFOL 10 MG/ML IV BOLUS
INTRAVENOUS | Status: DC | PRN
  Administered 2018-09-17: 70 mg via INTRAVENOUS
  Administered 2018-09-17: 30 mg via INTRAVENOUS

## 2018-09-17 MED ORDER — LACTATED RINGERS IV SOLN
INTRAVENOUS | Status: DC | PRN
  Administered 2018-09-17: 11:00:00 via INTRAVENOUS

## 2018-09-17 MED ORDER — SODIUM CHLORIDE 0.9 % IV SOLN: INTRAVENOUS | Status: DC

## 2018-09-17 MED ORDER — PROPOFOL 500 MG/50ML IV EMUL
INTRAVENOUS | Status: DC | PRN
  Administered 2018-09-17: 100 ug/kg/min via INTRAVENOUS

## 2018-09-17 MED ORDER — MEPERIDINE HCL 100 MG/ML IJ SOLN: 6.2500 mg | INTRAMUSCULAR | Status: DC | PRN

## 2018-09-17 MED ORDER — BUTAMBEN-TETRACAINE-BENZOCAINE 2-2-14 % EX AERO
INHALATION_SPRAY | CUTANEOUS | Status: DC | PRN
  Administered 2018-09-17: 1 via TOPICAL

## 2018-09-17 MED ORDER — ONDANSETRON HCL 4 MG/2ML IJ SOLN: 4.0000 mg | Freq: Once | INTRAMUSCULAR | Status: DC | PRN

## 2018-09-17 MED ORDER — FENTANYL CITRATE (PF) 100 MCG/2ML IJ SOLN: 25.0000 ug | INTRAMUSCULAR | Status: DC | PRN

## 2018-09-17 MED ORDER — LIDOCAINE 2% (20 MG/ML) 5 ML SYRINGE
INTRAMUSCULAR | Status: DC | PRN
  Administered 2018-09-17: 60 mg via INTRAVENOUS

## 2018-09-17 NOTE — Procedures (Signed)
Electrical Cardioversion Procedure Note Travis Chase [id number] 03-06-1946  Procedure: Electrical Cardioversion Indications:  Atrial Flutter  Procedure Details Consent: Risks of procedure as well as the alternatives and risks of each were explained to the (patient/caregiver).  Consent for procedure obtained. Time Out: Verified patient identification, verified procedure, site/side was marked, verified correct patient position, special equipment/implants available, medications/allergies/relevent history reviewed, required imaging and test results available.  Performed  Patient placed on cardiac monitor, pulse oximetry, supplemental oxygen as necessary.  Sedation given: Per anesthesiology Pacer pads placed anterior and posterior chest.  Cardioverted 1 time(s).  Cardioverted at 150J.  Evaluation Findings: Post procedure EKG shows: NSR Complications: None Patient did tolerate procedure well.   Travis Chase 09/17/2018, 11:29 AM

## 2018-09-17 NOTE — Interval H&P Note (Signed)
History and Physical Interval Note:  09/17/2018 11:14 AM  Travis Chase  has presented today for surgery, with the diagnosis of afib, not therapeutic  The various methods of treatment have been discussed with the patient and family. After consideration of risks, benefits and other options for treatment, the patient has consented to  Procedure(s): TRANSESOPHAGEAL ECHOCARDIOGRAM (TEE) (N/A) CARDIOVERSION (N/A) as a surgical intervention .  The patient's history has been reviewed, patient examined, no change in status, stable for surgery.  I have reviewed the patient's chart and labs.  Questions were answered to the patient's satisfaction.     Garry Nicolini [employer]

## 2018-09-17 NOTE — Progress Notes (Signed)
  Echocardiogram Echocardiogram Transesophageal has been performed.  Travis Chase Travis Chase 09/17/2018, 12:01 PM

## 2018-09-17 NOTE — Transfer of Care (Signed)
Immediate Anesthesia Transfer of Care Note  Patient: Travis Chase  Procedure(s) Performed: TRANSESOPHAGEAL ECHOCARDIOGRAM (TEE) (N/A ) CARDIOVERSION (N/A )  Patient Location: PACU  Anesthesia Type:MAC and General  Level of Consciousness: drowsy  Airway & Oxygen Therapy: Patient Spontanous Breathing and Patient connected to nasal cannula oxygen  Post-op Assessment: Report given to RN and Post -op Vital signs reviewed and stable  Post vital signs: Reviewed and stable  Last Vitals:  Vitals Value Taken Time  BP 111/78 09/17/2018 11:33 AM  Temp 36.5 C 09/17/2018 11:32 AM  Pulse 51 09/17/2018 11:37 AM  Resp 14 09/17/2018 11:37 AM  SpO2 99 % 09/17/2018 11:37 AM  Vitals shown include unvalidated device data.  Last Pain:  Vitals:   09/17/18 1132  TempSrc: Oral  PainSc: 0-No pain         Complications: No apparent anesthesia complications

## 2018-09-17 NOTE — Anesthesia Preprocedure Evaluation (Addendum)
Anesthesia Evaluation  Patient identified by MRN, date of birth, ID band Patient awake    Reviewed: Allergy & Precautions, NPO status , Patient's Chart, lab work & pertinent test results  Airway Mallampati: I  TM Distance: >3 FB Neck ROM: Full    Dental no notable dental hx. (+) Teeth Intact, Caps   Pulmonary sleep apnea and Continuous Positive Airway Pressure Ventilation , former smoker,    breath sounds clear to auscultation       Cardiovascular hypertension, Pt. on medications and Pt. on home beta blockers + dysrhythmias Atrial Fibrillation  Rhythm:Regular Rate:Normal  Dilated cardiomyopathy  LVEF 40-45%    Neuro/Psych negative neurological ROS  negative psych ROS   GI/Hepatic negative GI ROS, (+)     substance abuse  alcohol use, Chronic alcohol use   Endo/Other  Obesity Hyperlipidemia  Renal/GU negative Renal ROS   BPH    Musculoskeletal  (+) Arthritis , Osteoarthritis,    Abdominal (+) + obese,   Peds  Hematology Coumadin and xarelto therapy- ? Compliance , non therapeutic Xarelto - last dose this am   Anesthesia Other Findings   Reproductive/Obstetrics                            Anesthesia Physical Anesthesia Plan  ASA: III  Anesthesia Plan: General   Post-op Pain Management:    Induction: Intravenous  PONV Risk Score and Plan: 2 and Ondansetron, Propofol infusion and Treatment may vary due to age or medical condition  Airway Management Planned: Mask  Additional Equipment:   Intra-op Plan:   Post-operative Plan:   Informed Consent: I have reviewed the patients History and Physical, chart, labs and discussed the procedure including the risks, benefits and alternatives for the proposed anesthesia with the patient or authorized representative who has indicated his/her understanding and acceptance.   Dental advisory given  Plan Discussed with: CRNA and  Surgeon  Anesthesia Plan Comments:         Anesthesia Quick Evaluation

## 2018-09-17 NOTE — Anesthesia Postprocedure Evaluation (Signed)
Anesthesia Post Note  Patient: Travis Chase  Procedure(s) Performed: TRANSESOPHAGEAL ECHOCARDIOGRAM (TEE) (N/A ) CARDIOVERSION (N/A )     Patient location during evaluation: PACU Anesthesia Type: General Level of consciousness: awake and alert and oriented Pain management: pain level controlled Vital Signs Assessment: post-procedure vital signs reviewed and stable Respiratory status: spontaneous breathing, nonlabored ventilation and respiratory function stable Cardiovascular status: blood pressure returned to baseline and stable Postop Assessment: no apparent nausea or vomiting Anesthetic complications: no    Last Vitals:  Vitals:   09/17/18 0951 09/17/18 1132  BP: (!) 152/97 111/78  Pulse: 88 (!) 55  Resp:  14  Temp: 37.2 C 36.5 C  SpO2: 98% 96%    Last Pain:  Vitals:   09/17/18 1132  TempSrc: Oral  PainSc: 0-No pain                 Elianny Buxbaum A.

## 2018-09-17 NOTE — Discharge Instructions (Signed)
TEE ° °YOU HAD AN CARDIAC PROCEDURE TODAY: Refer to the procedure report and other information in the discharge instructions given to you for any specific questions about what was found during the examination. If this information does not answer your questions, please call [facility] office at [phone] to clarify.  ° °DIET: Your first meal following the procedure should be a light meal and then it is ok to progress to your normal diet. A half-sandwich or bowl of soup is an example of a good first meal. Heavy or fried foods are harder to digest and may make you feel nauseous or bloated. Drink plenty of fluids but you should avoid alcoholic beverages for 24 hours. If you had a esophageal dilation, please see attached instructions for diet.  ° °ACTIVITY: Your care partner should take you home directly after the procedure. You should plan to take it easy, moving slowly for the rest of the day. You can resume normal activity the day after the procedure however YOU SHOULD NOT DRIVE, use power tools, machinery or perform tasks that involve climbing or major physical exertion for 24 hours (because of the sedation medicines used during the test).  ° °SYMPTOMS TO REPORT IMMEDIATELY: °A cardiologist can be reached at any hour. Please call [phone] for any of the following symptoms:  °Vomiting of blood or coffee ground material  °New, significant abdominal pain  °New, significant chest pain or pain under the shoulder blades  °Painful or persistently difficult swallowing  °New shortness of breath  °Black, tarry-looking or red, bloody stools ° °FOLLOW UP:  °Please also call with any specific questions about appointments or follow up tests. ° °Electrical Cardioversion, Care After °This sheet gives you information about how to care for yourself after your procedure. Your health care provider may also give you more specific instructions. If you have problems or questions, contact your health care provider. °What can I  expect after the procedure? °After the procedure, it is common to have: °· Some redness on the skin where the shocks were given. ° °Follow these instructions at home: °· Do not drive for 24 hours if you were given a medicine to help you relax (sedative). °· Take over-the-counter and prescription medicines only as told by your health care provider. °· Ask your health care provider how to check your pulse. Check it often. °· Rest for 48 hours after the procedure or as told by your health care provider. °· Avoid or limit your caffeine use as told by your health care provider. °Contact a health care provider if: °· You feel like your heart is beating too quickly or your pulse is not regular. °· You have a serious muscle cramp that does not go away. °Get help right away if: °· You have discomfort in your chest. °· You are dizzy or you feel faint. °· You have trouble breathing or you are short of breath. °· Your speech is slurred. °· You have trouble moving an arm or leg on one side of your body. °· Your fingers or toes turn cold or blue. °This information is not intended to replace advice given to you by your health care provider. Make sure you discuss any questions you have with your health care provider. °Document Released: 08/18/2013 Document Revised: 05/31/2016 Document Reviewed: 05/03/2016 °Elsevier Interactive Patient Education © 2018 [employer]vier Inc[employer] ° °

## 2018-09-17 NOTE — CV Procedure (Signed)
Procedure: TEE   Indication: Atrial fibrillation  Sedation: Per anesthesiology.   Findings: Please see echo section for full report.  Normal LV size and systolic function, EF 07-12%.  Mild LV hypertrophy.  Normal RV size and systolic function.  Moderate left atrial enlargement, no LA appendage thrombus.  Mild right atrial enlargement, Chiari network noted.  No ASD/PFO by color doppler.  Trivial TR.  Trivial PI.  Trileaflet aortic valve with no stenosis, trivial AI.  Trivial mitral regurgitation.  Normal caliber aorta with minimal plaque.   May proceed to DCCV.   [name] 09/17/2018 11:29 AM

## 2018-09-19 ENCOUNTER — Encounter (HOSPITAL_COMMUNITY): Payer: Self-pay | Admitting: Cardiology

## 2018-09-22 ENCOUNTER — Ambulatory Visit (INDEPENDENT_AMBULATORY_CARE_PROVIDER_SITE_OTHER): Payer: Medicare Other | Admitting: Pharmacist

## 2018-09-22 DIAGNOSIS — Z5181 Encounter for therapeutic drug level monitoring: Secondary | ICD-10-CM | POA: Diagnosis not present

## 2018-09-22 DIAGNOSIS — I483 Typical atrial flutter: Secondary | ICD-10-CM

## 2018-09-22 LAB — POCT INR: INR: 2.5 (ref 2.0–3.0)

## 2018-09-22 NOTE — Patient Instructions (Signed)
Description   Continue taking 1 tablet daily except 1/2 tablet on Fridays. Recheck INR in 1 week. Please call the Coumadin Clinic with any Coumadin related issues and with new medications/changes and procedures/surgeries: [facility] [place] [phone]

## 2018-09-27 ENCOUNTER — Other Ambulatory Visit: Payer: Self-pay | Admitting: Physician Assistant

## 2018-09-29 ENCOUNTER — Ambulatory Visit (INDEPENDENT_AMBULATORY_CARE_PROVIDER_SITE_OTHER): Payer: Medicare Other | Admitting: Pharmacist

## 2018-09-29 DIAGNOSIS — Z5181 Encounter for therapeutic drug level monitoring: Secondary | ICD-10-CM | POA: Diagnosis not present

## 2018-09-29 DIAGNOSIS — I483 Typical atrial flutter: Secondary | ICD-10-CM | POA: Diagnosis not present

## 2018-09-29 LAB — POCT INR: INR: 2.5 (ref 2.0–3.0)

## 2018-09-29 NOTE — Patient Instructions (Signed)
Description   Continue taking 1 tablet daily except 1/2 tablet on Fridays. Recheck INR in 1 week. Please call the Coumadin Clinic with any Coumadin related issues and with new medications/changes and procedures/surgeries: [facility] [place] [phone]

## 2018-09-30 DIAGNOSIS — H6091 Unspecified otitis externa, right ear: Secondary | ICD-10-CM | POA: Diagnosis not present

## 2018-10-13 ENCOUNTER — Ambulatory Visit (INDEPENDENT_AMBULATORY_CARE_PROVIDER_SITE_OTHER): Payer: Medicare Other | Admitting: Pharmacist

## 2018-10-13 DIAGNOSIS — Z5181 Encounter for therapeutic drug level monitoring: Secondary | ICD-10-CM | POA: Diagnosis not present

## 2018-10-13 DIAGNOSIS — I483 Typical atrial flutter: Secondary | ICD-10-CM

## 2018-10-13 LAB — POCT INR: INR: 3 (ref 2.0–3.0)

## 2018-10-13 NOTE — Patient Instructions (Signed)
Description   Continue taking 1 tablet daily except 1/2 tablet on Fridays. Recheck INR in 2 week. Please call the Coumadin Clinic with any Coumadin related issues and with new medications/changes and procedures/surgeries: [facility] [place] [phone]

## 2018-10-28 ENCOUNTER — Ambulatory Visit: Payer: Medicare Other | Admitting: Internal Medicine

## 2018-10-28 ENCOUNTER — Encounter: Payer: Self-pay | Admitting: Internal Medicine

## 2018-10-28 ENCOUNTER — Ambulatory Visit (INDEPENDENT_AMBULATORY_CARE_PROVIDER_SITE_OTHER): Payer: Medicare Other | Admitting: *Deleted

## 2018-10-28 VITALS — BP 132/82 | HR 50 | Ht 70.0 in | Wt 222.0 lb

## 2018-10-28 DIAGNOSIS — I4892 Unspecified atrial flutter: Secondary | ICD-10-CM | POA: Diagnosis not present

## 2018-10-28 DIAGNOSIS — I483 Typical atrial flutter: Secondary | ICD-10-CM | POA: Diagnosis not present

## 2018-10-28 DIAGNOSIS — Z5181 Encounter for therapeutic drug level monitoring: Secondary | ICD-10-CM | POA: Diagnosis not present

## 2018-10-28 LAB — POCT INR: INR: 2.1 (ref 2.0–3.0)

## 2018-10-28 MED ORDER — AMIODARONE HCL 200 MG PO TABS: 200.0000 mg | ORAL_TABLET | Freq: Every day | ORAL | 3 refills | Status: DC

## 2018-10-28 NOTE — Patient Instructions (Signed)
Description   Continue taking 1 tablet daily except 1/2 tablet on Fridays. Recheck INR in 2 week. Please call the Coumadin Clinic with any Coumadin related issues and with new medications/changes and procedures/surgeries: [facility] [place] [phone]
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
## 2018-10-28 NOTE — Patient Instructions (Addendum)
Medication Instructions:  Your physician has recommended you make the following change in your medication:   1.  Reduce your amiodarone 200 mg--Take one tablet by mouth ONCE daily.  Labwork: None ordered.  Testing/Procedures: None ordered.  Follow-Up: Your physician wants you to follow-up in: 6 months with Dr. Lovena Le.   You will receive a reminder letter in the mail two months in advance. If you don't receive a letter, please call our office to schedule the follow-up appointment.  Any Other Special Instructions Will Be Listed Below (If Applicable).  If you need a refill on your cardiac medications before your next appointment, please call your pharmacy.

## 2018-11-13 ENCOUNTER — Ambulatory Visit: Payer: Medicare Other

## 2018-11-13 DIAGNOSIS — Z5181 Encounter for therapeutic drug level monitoring: Secondary | ICD-10-CM

## 2018-11-13 DIAGNOSIS — I483 Typical atrial flutter: Secondary | ICD-10-CM

## 2018-11-13 LAB — POCT INR: INR: 2.4 (ref 2.0–3.0)

## 2018-11-13 NOTE — Patient Instructions (Signed)
Description   Continue taking 1 tablet daily except 1/2 tablet on Fridays. Recheck INR in 2 week secondary to Amiodarone being decreased on 10/28/18. Please call the Coumadin Clinic with any Coumadin related issues and with new medications/changes and procedures/surgeries: [facility] [place] [phone]

## 2018-12-01 ENCOUNTER — Ambulatory Visit (INDEPENDENT_AMBULATORY_CARE_PROVIDER_SITE_OTHER): Payer: Medicare Other | Admitting: *Deleted

## 2018-12-01 DIAGNOSIS — Z5181 Encounter for therapeutic drug level monitoring: Secondary | ICD-10-CM

## 2018-12-01 DIAGNOSIS — I483 Typical atrial flutter: Secondary | ICD-10-CM

## 2018-12-01 LAB — POCT INR: INR: 1.5 — AB (ref 2.0–3.0)

## 2018-12-01 NOTE — Patient Instructions (Signed)
Description   Today and tomorrow take 1.5 tablets, then Continue taking 1 tablet daily except 1/2 tablet on Fridays. Recheck INR in 2 week secondary to Amiodarone being decreased on 10/28/18. Please call the Coumadin Clinic with any Coumadin related issues and with new medications/changes and procedures/surgeries: [facility] [place] [phone]

## 2018-12-15 ENCOUNTER — Ambulatory Visit (INDEPENDENT_AMBULATORY_CARE_PROVIDER_SITE_OTHER): Payer: Medicare Other | Admitting: *Deleted

## 2018-12-15 DIAGNOSIS — Z5181 Encounter for therapeutic drug level monitoring: Secondary | ICD-10-CM | POA: Diagnosis not present

## 2018-12-15 DIAGNOSIS — I483 Typical atrial flutter: Secondary | ICD-10-CM | POA: Diagnosis not present

## 2018-12-15 LAB — POCT INR: INR: 2.4 (ref 2.0–3.0)

## 2018-12-15 NOTE — Patient Instructions (Signed)
Description    Continue taking 1 tablet daily except 1/2 tablet on Fridays. Recheck INR in 3-4 weeks secondary to Amiodarone being decreased on 10/28/18. Please call the Coumadin Clinic with any Coumadin related issues and with new medications/changes and procedures/surgeries: [facility] [place] [phone]

## 2019-01-19 ENCOUNTER — Ambulatory Visit (INDEPENDENT_AMBULATORY_CARE_PROVIDER_SITE_OTHER): Payer: Medicare Other | Admitting: *Deleted

## 2019-01-19 DIAGNOSIS — Z5181 Encounter for therapeutic drug level monitoring: Secondary | ICD-10-CM

## 2019-01-19 DIAGNOSIS — I483 Typical atrial flutter: Secondary | ICD-10-CM

## 2019-01-19 LAB — POCT INR: INR: 4 — AB (ref 2.0–3.0)

## 2019-01-19 NOTE — Patient Instructions (Addendum)
Description   Skip today's dose, then  Continue taking 1 tablet daily except 1/2 tablet on Fridays. Recheck INR in 2 weeks. Please call the Coumadin Clinic with any Coumadin related issues and with new medications/changes and procedures/surgeries: [facility] [place] [phone]

## 2019-02-02 ENCOUNTER — Ambulatory Visit (INDEPENDENT_AMBULATORY_CARE_PROVIDER_SITE_OTHER): Payer: Medicare Other | Admitting: Pharmacist

## 2019-02-02 ENCOUNTER — Other Ambulatory Visit: Payer: Self-pay

## 2019-02-02 DIAGNOSIS — I483 Typical atrial flutter: Secondary | ICD-10-CM | POA: Diagnosis not present

## 2019-02-02 DIAGNOSIS — Z5181 Encounter for therapeutic drug level monitoring: Secondary | ICD-10-CM | POA: Diagnosis not present

## 2019-02-02 LAB — POCT INR: INR: 2.1 (ref 2.0–3.0)

## 2019-02-02 NOTE — Patient Instructions (Signed)
Description   Continue taking 1 tablet daily except 1/2 tablet on Fridays. Recheck INR in 3 weeks. Please call the Coumadin Clinic with any Coumadin related issues and with new medications/changes and procedures/surgeries: [facility] [place] [phone]

## 2019-02-22 ENCOUNTER — Telehealth: Payer: Self-pay

## 2019-02-22 NOTE — Telephone Encounter (Signed)
lmom for prescreen  

## 2019-02-23 ENCOUNTER — Ambulatory Visit (INDEPENDENT_AMBULATORY_CARE_PROVIDER_SITE_OTHER): Payer: Medicare Other | Admitting: *Deleted

## 2019-02-23 ENCOUNTER — Other Ambulatory Visit: Payer: Self-pay

## 2019-02-23 DIAGNOSIS — Z5181 Encounter for therapeutic drug level monitoring: Secondary | ICD-10-CM

## 2019-02-23 DIAGNOSIS — I483 Typical atrial flutter: Secondary | ICD-10-CM

## 2019-02-23 LAB — POCT INR: INR: 1.7 — AB (ref 2.0–3.0)

## 2019-02-23 NOTE — Patient Instructions (Addendum)
Description   Spoke with pt and instructed pt to take 1.5 tablets today then continue taking 1 tablet daily except 1/2 tablet on Fridays. Recheck INR in 4 weeks. Please call the Coumadin Clinic with any Coumadin related issues and with new medications/changes and procedures/surgeries: [facility] [place] [phone]

## 2019-03-20 DIAGNOSIS — H6091 Unspecified otitis externa, right ear: Secondary | ICD-10-CM | POA: Diagnosis not present

## 2019-03-20 DIAGNOSIS — J309 Allergic rhinitis, unspecified: Secondary | ICD-10-CM | POA: Diagnosis not present

## 2019-03-22 ENCOUNTER — Telehealth: Payer: Self-pay

## 2019-03-22 NOTE — Telephone Encounter (Signed)
lmom for prescreen  

## 2019-03-23 ENCOUNTER — Other Ambulatory Visit: Payer: Self-pay

## 2019-03-23 ENCOUNTER — Ambulatory Visit (INDEPENDENT_AMBULATORY_CARE_PROVIDER_SITE_OTHER): Payer: Medicare Other | Admitting: *Deleted

## 2019-03-23 DIAGNOSIS — I4891 Unspecified atrial fibrillation: Secondary | ICD-10-CM

## 2019-03-23 DIAGNOSIS — I483 Typical atrial flutter: Secondary | ICD-10-CM | POA: Diagnosis not present

## 2019-03-23 DIAGNOSIS — Z5181 Encounter for therapeutic drug level monitoring: Secondary | ICD-10-CM | POA: Diagnosis not present

## 2019-03-23 LAB — POCT INR: INR: 4.3 — AB (ref 2.0–3.0)

## 2019-04-02 ENCOUNTER — Telehealth: Payer: Self-pay

## 2019-04-02 NOTE — Telephone Encounter (Signed)
lmom for prescreen  

## 2019-04-06 ENCOUNTER — Other Ambulatory Visit: Payer: Self-pay

## 2019-04-06 ENCOUNTER — Ambulatory Visit (INDEPENDENT_AMBULATORY_CARE_PROVIDER_SITE_OTHER): Payer: Medicare Other | Admitting: *Deleted

## 2019-04-06 DIAGNOSIS — I483 Typical atrial flutter: Secondary | ICD-10-CM | POA: Diagnosis not present

## 2019-04-06 DIAGNOSIS — Z5181 Encounter for therapeutic drug level monitoring: Secondary | ICD-10-CM

## 2019-04-06 LAB — POCT INR: INR: 2.2 (ref 2.0–3.0)

## 2019-04-06 NOTE — Patient Instructions (Signed)
Description   continue taking 1 tablet daily except 1/2 tablet on Fridays. Recheck INR in 3 weeks. Please call the Coumadin Clinic with any Coumadin related issues and with new medications/changes and procedures/surgeries: [facility] [place] [phone]

## 2019-04-19 ENCOUNTER — Telehealth: Payer: Self-pay | Admitting: Pharmacist

## 2019-04-19 NOTE — Telephone Encounter (Signed)
1. COVID-19 Pre-Screening Questions:  . In the past 7 to 10 days have you had a cough,  shortness of breath, headache, congestion, fever (100 or greater) body aches, chills, sore throat, or sudden loss of taste or sense of smell?  No . Have you been around anyone with known Covid 19.  No . Have you been around anyone who is awaiting Covid 19 test results in the past 7 to 10 days?  No . Have you been around anyone who has been exposed to Covid 19, or has mentioned symptoms of Covid 19 within the past 7 to 10 days?  No    2. Pt advised of visitor restrictions (no visitors allowed except if needed to conduct the visit). Also advised to arrive at appointment time and wear a mask.    Pt coming to [facility] office as he could not make [place] appt the following week and 6/9 schedule was booked in [place].

## 2019-04-20 ENCOUNTER — Other Ambulatory Visit: Payer: Self-pay

## 2019-04-20 ENCOUNTER — Ambulatory Visit (INDEPENDENT_AMBULATORY_CARE_PROVIDER_SITE_OTHER): Payer: Medicare Other | Admitting: *Deleted

## 2019-04-20 DIAGNOSIS — I483 Typical atrial flutter: Secondary | ICD-10-CM | POA: Diagnosis not present

## 2019-04-20 DIAGNOSIS — Z5181 Encounter for therapeutic drug level monitoring: Secondary | ICD-10-CM | POA: Diagnosis not present

## 2019-04-20 LAB — POCT INR: INR: 3 (ref 2.0–3.0)

## 2019-04-20 NOTE — Patient Instructions (Signed)
Description   continue taking 1 tablet daily except 1/2 tablet on Fridays. Recheck INR in 4 weeks. Please call the Coumadin Clinic with any Coumadin related issues and with new medications/changes and procedures/surgeries: [facility] [place] [phone]

## 2019-05-11 ENCOUNTER — Telehealth: Payer: Self-pay

## 2019-05-11 NOTE — Telephone Encounter (Signed)
lmom for prescreen  

## 2019-05-18 ENCOUNTER — Ambulatory Visit (INDEPENDENT_AMBULATORY_CARE_PROVIDER_SITE_OTHER): Payer: Medicare Other | Admitting: *Deleted

## 2019-05-18 ENCOUNTER — Other Ambulatory Visit: Payer: Self-pay

## 2019-05-18 DIAGNOSIS — I483 Typical atrial flutter: Secondary | ICD-10-CM | POA: Diagnosis not present

## 2019-05-18 DIAGNOSIS — Z5181 Encounter for therapeutic drug level monitoring: Secondary | ICD-10-CM | POA: Diagnosis not present

## 2019-05-18 LAB — POCT INR: INR: 3.9 — AB (ref 2.0–3.0)

## 2019-05-18 NOTE — Patient Instructions (Addendum)
  Description   Skip today's dose, then start taking 1 tablet daily except 1/2 tablet on Mondays and  Fridays. Recheck INR in 3 weeks. Please call the Coumadin Clinic with any Coumadin related issues and with new medications/changes and procedures/surgeries: [facility] [place] [phone]

## 2019-06-08 ENCOUNTER — Ambulatory Visit (INDEPENDENT_AMBULATORY_CARE_PROVIDER_SITE_OTHER): Payer: Medicare Other | Admitting: *Deleted

## 2019-06-08 ENCOUNTER — Other Ambulatory Visit: Payer: Self-pay

## 2019-06-08 DIAGNOSIS — Z0000 Encounter for general adult medical examination without abnormal findings: Secondary | ICD-10-CM | POA: Diagnosis not present

## 2019-06-08 DIAGNOSIS — Z5181 Encounter for therapeutic drug level monitoring: Secondary | ICD-10-CM | POA: Diagnosis not present

## 2019-06-08 DIAGNOSIS — E785 Hyperlipidemia, unspecified: Secondary | ICD-10-CM | POA: Diagnosis not present

## 2019-06-08 DIAGNOSIS — I483 Typical atrial flutter: Secondary | ICD-10-CM

## 2019-06-08 DIAGNOSIS — I10 Essential (primary) hypertension: Secondary | ICD-10-CM | POA: Diagnosis not present

## 2019-06-08 DIAGNOSIS — Z79899 Other long term (current) drug therapy: Secondary | ICD-10-CM | POA: Diagnosis not present

## 2019-06-08 LAB — POCT INR: INR: 3 (ref 2.0–3.0)

## 2019-06-08 NOTE — Patient Instructions (Signed)
Description   Continue  taking 1 tablet daily except 1/2 tablet on Mondays and  Fridays. Recheck INR in 4 weeks. Please call the Coumadin Clinic with any Coumadin related issues and with new medications/changes and procedures/surgeries: [facility] [place] [phone]

## 2019-07-06 ENCOUNTER — Other Ambulatory Visit: Payer: Self-pay

## 2019-07-06 ENCOUNTER — Ambulatory Visit (INDEPENDENT_AMBULATORY_CARE_PROVIDER_SITE_OTHER): Payer: Medicare Other | Admitting: *Deleted

## 2019-07-06 DIAGNOSIS — I483 Typical atrial flutter: Secondary | ICD-10-CM

## 2019-07-06 DIAGNOSIS — Z5181 Encounter for therapeutic drug level monitoring: Secondary | ICD-10-CM

## 2019-07-06 LAB — POCT INR: INR: 3.4 — AB (ref 2.0–3.0)

## 2019-07-06 NOTE — Patient Instructions (Signed)
Description   Skip today's dose, then start taking  1 tablet daily except 1/2 tablet on Mondays, Wednesdays and  Fridays. Recheck INR in 2 weeks. Please call the Coumadin Clinic with any Coumadin related issues and with new medications/changes and procedures/surgeries: [facility] [place] [phone]

## 2019-07-20 ENCOUNTER — Other Ambulatory Visit: Payer: Self-pay

## 2019-07-20 ENCOUNTER — Ambulatory Visit (INDEPENDENT_AMBULATORY_CARE_PROVIDER_SITE_OTHER): Payer: Medicare Other | Admitting: *Deleted

## 2019-07-20 DIAGNOSIS — I483 Typical atrial flutter: Secondary | ICD-10-CM | POA: Diagnosis not present

## 2019-07-20 DIAGNOSIS — Z5181 Encounter for therapeutic drug level monitoring: Secondary | ICD-10-CM

## 2019-07-20 LAB — POCT INR: INR: 2.4 (ref 2.0–3.0)

## 2019-07-20 NOTE — Patient Instructions (Addendum)
Description   Continue taking 1 tablet daily except 1/2 tablet on Mondays, Wednesdays and  Fridays. Recheck INR in 4 weeks. Please call the Coumadin Clinic with any Coumadin related issues and with new medications/changes and procedures/surgeries: [facility] [place] [phone]

## 2019-08-17 ENCOUNTER — Ambulatory Visit (INDEPENDENT_AMBULATORY_CARE_PROVIDER_SITE_OTHER): Payer: Medicare Other | Admitting: Pharmacist

## 2019-08-17 ENCOUNTER — Other Ambulatory Visit: Payer: Self-pay

## 2019-08-17 DIAGNOSIS — Z5181 Encounter for therapeutic drug level monitoring: Secondary | ICD-10-CM

## 2019-08-17 DIAGNOSIS — I4891 Unspecified atrial fibrillation: Secondary | ICD-10-CM | POA: Diagnosis not present

## 2019-08-17 DIAGNOSIS — Z7901 Long term (current) use of anticoagulants: Secondary | ICD-10-CM

## 2019-08-17 DIAGNOSIS — I483 Typical atrial flutter: Secondary | ICD-10-CM

## 2019-08-17 DIAGNOSIS — Z23 Encounter for immunization: Secondary | ICD-10-CM | POA: Diagnosis not present

## 2019-08-17 LAB — POCT INR: INR: 2.1 (ref 2.0–3.0)

## 2019-08-17 NOTE — Patient Instructions (Signed)
Description   Continue taking 1 tablet daily except 1/2 tablet on Mondays, Wednesdays and  Fridays. Recheck INR in 4 weeks. Please call the Coumadin Clinic with any Coumadin related issues and with new medications/changes and procedures/surgeries: [facility] [place] [phone]

## 2019-08-29 ENCOUNTER — Other Ambulatory Visit: Payer: Self-pay | Admitting: Internal Medicine

## 2019-09-14 ENCOUNTER — Ambulatory Visit (INDEPENDENT_AMBULATORY_CARE_PROVIDER_SITE_OTHER): Payer: Medicare Other | Admitting: Pharmacist

## 2019-09-14 ENCOUNTER — Other Ambulatory Visit: Payer: Self-pay

## 2019-09-14 ENCOUNTER — Other Ambulatory Visit (INDEPENDENT_AMBULATORY_CARE_PROVIDER_SITE_OTHER): Payer: Medicare Other

## 2019-09-14 DIAGNOSIS — I4891 Unspecified atrial fibrillation: Secondary | ICD-10-CM | POA: Diagnosis not present

## 2019-09-14 DIAGNOSIS — I483 Typical atrial flutter: Secondary | ICD-10-CM | POA: Diagnosis not present

## 2019-09-14 DIAGNOSIS — Z5181 Encounter for therapeutic drug level monitoring: Secondary | ICD-10-CM | POA: Diagnosis not present

## 2019-09-14 LAB — POCT INR: INR: 2.4 (ref 2.0–3.0)

## 2019-09-29 DIAGNOSIS — N433 Hydrocele, unspecified: Secondary | ICD-10-CM | POA: Diagnosis not present

## 2019-10-14 DIAGNOSIS — N430 Encysted hydrocele: Secondary | ICD-10-CM | POA: Diagnosis not present

## 2019-10-14 DIAGNOSIS — K4020 Bilateral inguinal hernia, without obstruction or gangrene, not specified as recurrent: Secondary | ICD-10-CM | POA: Diagnosis not present

## 2019-10-19 ENCOUNTER — Other Ambulatory Visit (INDEPENDENT_AMBULATORY_CARE_PROVIDER_SITE_OTHER): Payer: Medicare Other

## 2019-10-19 ENCOUNTER — Other Ambulatory Visit: Payer: Self-pay

## 2019-10-19 ENCOUNTER — Ambulatory Visit (INDEPENDENT_AMBULATORY_CARE_PROVIDER_SITE_OTHER): Payer: Medicare Other

## 2019-10-19 DIAGNOSIS — I483 Typical atrial flutter: Secondary | ICD-10-CM

## 2019-10-19 DIAGNOSIS — I4891 Unspecified atrial fibrillation: Secondary | ICD-10-CM

## 2019-10-19 DIAGNOSIS — I5042 Chronic combined systolic (congestive) and diastolic (congestive) heart failure: Secondary | ICD-10-CM

## 2019-10-19 DIAGNOSIS — Z5181 Encounter for therapeutic drug level monitoring: Secondary | ICD-10-CM | POA: Diagnosis not present

## 2019-10-19 LAB — POCT INR: INR: 2.7 (ref 2.0–3.0)

## 2019-10-19 NOTE — Patient Instructions (Signed)
Description   Continue on same dosage 1 tablet daily except 1/2 tablet on Mondays and Fridays. Recheck INR in 4 weeks. Please call the Coumadin Clinic with any Coumadin related issues and with new medications/changes and procedures/surgeries: [facility] [phone]

## 2019-10-29 ENCOUNTER — Other Ambulatory Visit: Payer: Self-pay | Admitting: Internal Medicine

## 2019-11-09 DIAGNOSIS — H903 Sensorineural hearing loss, bilateral: Secondary | ICD-10-CM | POA: Diagnosis not present

## 2019-11-16 ENCOUNTER — Other Ambulatory Visit: Payer: Self-pay

## 2019-11-16 ENCOUNTER — Other Ambulatory Visit (INDEPENDENT_AMBULATORY_CARE_PROVIDER_SITE_OTHER): Payer: Medicare Other

## 2019-11-16 ENCOUNTER — Ambulatory Visit (INDEPENDENT_AMBULATORY_CARE_PROVIDER_SITE_OTHER): Payer: Medicare Other | Admitting: Pharmacist

## 2019-11-16 DIAGNOSIS — Z5181 Encounter for therapeutic drug level monitoring: Secondary | ICD-10-CM | POA: Diagnosis not present

## 2019-11-16 DIAGNOSIS — I483 Typical atrial flutter: Secondary | ICD-10-CM

## 2019-11-16 DIAGNOSIS — I4891 Unspecified atrial fibrillation: Secondary | ICD-10-CM | POA: Diagnosis not present

## 2019-11-16 LAB — POCT INR: INR: 2.2 (ref 2.0–3.0)

## 2019-11-16 NOTE — Patient Instructions (Signed)
Continue on same dosage 1 tablet daily except 1/2 tablet on Mondays and Fridays. Recheck INR in 5 weeks. Please call the Coumadin Clinic with any Coumadin related issues and with new medications/changes and procedures/surgeries: [facility] [phone]

## 2019-12-06 ENCOUNTER — Ambulatory Visit: Payer: Self-pay

## 2019-12-06 ENCOUNTER — Encounter: Payer: Self-pay | Admitting: Orthopedic Surgery

## 2019-12-06 ENCOUNTER — Other Ambulatory Visit: Payer: Self-pay

## 2019-12-06 ENCOUNTER — Ambulatory Visit: Payer: Medicare Other | Admitting: Orthopedic Surgery

## 2019-12-06 DIAGNOSIS — G8929 Other chronic pain: Secondary | ICD-10-CM | POA: Diagnosis not present

## 2019-12-06 DIAGNOSIS — M1712 Unilateral primary osteoarthritis, left knee: Secondary | ICD-10-CM | POA: Diagnosis not present

## 2019-12-06 DIAGNOSIS — M25562 Pain in left knee: Secondary | ICD-10-CM

## 2019-12-07 ENCOUNTER — Encounter: Payer: Self-pay | Admitting: Orthopedic Surgery
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
## 2019-12-23 ENCOUNTER — Other Ambulatory Visit: Payer: Self-pay

## 2019-12-23 ENCOUNTER — Ambulatory Visit: Payer: Medicare Other | Admitting: Internal Medicine

## 2019-12-23 ENCOUNTER — Encounter: Payer: Self-pay | Admitting: Internal Medicine

## 2019-12-23 ENCOUNTER — Ambulatory Visit (INDEPENDENT_AMBULATORY_CARE_PROVIDER_SITE_OTHER): Payer: Medicare Other | Admitting: Pharmacist

## 2019-12-23 VITALS — BP 136/86 | HR 62 | Ht 69.5 in | Wt 228.0 lb

## 2019-12-23 DIAGNOSIS — I10 Essential (primary) hypertension: Secondary | ICD-10-CM | POA: Diagnosis not present

## 2019-12-23 DIAGNOSIS — I4819 Other persistent atrial fibrillation: Secondary | ICD-10-CM | POA: Insufficient documentation

## 2019-12-23 DIAGNOSIS — I483 Typical atrial flutter: Secondary | ICD-10-CM | POA: Diagnosis not present

## 2019-12-23 DIAGNOSIS — Z5181 Encounter for therapeutic drug level monitoring: Secondary | ICD-10-CM

## 2019-12-23 LAB — POCT INR: INR: 2.7 (ref 2.0–3.0)

## 2019-12-23 MED ORDER — AMIODARONE HCL 200 MG PO TABS: ORAL_TABLET | ORAL | 3 refills | Status: DC

## 2019-12-23 NOTE — Patient Instructions (Addendum)
Medication Instructions:  Your physician has recommended you make the following change in your medication:   1.  Decrease your amiodarone 200 mg-  Take one tablet by mouth Monday through Saturday.  Do NOT take on Sunday.   Labwork: None ordered.  Testing/Procedures: None ordered.  Follow-Up: Your physician wants you to follow-up in: one year with Dr. Lovena Le.   You will receive a reminder letter in the mail two months in advance. If you don't receive a letter, please call our office to schedule the follow-up appointment.   Any Other Special Instructions Will Be Listed Below (If Applicable).  If you need a refill on your cardiac medications before your next appointment, please call your pharmacy.
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
## 2019-12-23 NOTE — Patient Instructions (Signed)
Continue on same dosage 1 tablet daily except 1/2 tablet on Mondays and Fridays. Recheck INR in 4 weeks. Please call the Coumadin Clinic with any Coumadin related issues and with new medications/changes and procedures/surgeries: [facility] [place] [phone]

## 2019-12-24 ENCOUNTER — Telehealth: Payer: Self-pay | Admitting: Orthopedic Surgery

## 2019-12-24 NOTE — Telephone Encounter (Signed)
Patient has been cleared for surgery by his cardiologist Dr. Crissie Sickles.  Patient was seen yesterday. Notes in Epic  "He is low risk and may proceed, stopping coumadin 5 days before surgery."  Message was left today on patient's voicemail requesting he call me to set up a surgery date for Left medial unicompartmental knee replacement.

## 2019-12-27 NOTE — Telephone Encounter (Signed)
FYI

## 2019-12-29 ENCOUNTER — Other Ambulatory Visit: Payer: Self-pay | Admitting: Internal Medicine

## 2019-12-29 NOTE — Telephone Encounter (Signed)
Done I gave sheet to debbie

## 2020-01-04 ENCOUNTER — Other Ambulatory Visit: Payer: Self-pay

## 2020-01-07 DIAGNOSIS — M545 Low back pain: Secondary | ICD-10-CM | POA: Diagnosis not present

## 2020-01-07 NOTE — Progress Notes (Signed)
[id number] - [place], [place] - [address] [facility] [place] [address] Phone: [phone] Fax: [phone]      Your procedure is scheduled on January 13, 2020.  Report to [facility] Main Entrance "A" at 10:15 A.M., and check in at the Admitting office.  Call this number if you have problems the morning of surgery:  [phone]  Call [phone] if you have any questions prior to your surgery date Monday-Friday 8am-4pm    Remember:  Do not eat after midnight the night before your surgery  You may drink clear liquids until 9:15 A.M. the morning of your surgery.   Clear liquids allowed are: Water, Non-Citrus Juices (without pulp), Carbonated Beverages, Clear Tea, Black Coffee Only, and Gatorade  Please complete your PRE-SURGERY ENSURE that was provided to you by 9:15 A.M.  the morning of surgery.  Please, if able, drink it in one setting. DO NOT SIP.    Take these medicines the morning of surgery with A SIP OF WATER: amiodarone (PACERONE) metoprolol tartrate (LOPRESSOR)   Follow your surgeon's instructions on when to stop Coumadin.  If no instructions were given by your surgeon then you will need to call the office to get those instructions.    As of today, STOP taking any Aspirin (unless otherwise instructed by your surgeon), Aleve, Naproxen, Ibuprofen, Motrin, Advil, Goody's, BC's, all herbal medications, fish oil, and all vitamins.    The Morning of Surgery  Do not wear jewelry.  Do not wear lotions, powders, colognes, or deodorant   Men may shave face and neck.  Do not bring valuables to the hospital.  [facility] is not responsible for any belongings or valuables.  If you are a smoker, DO NOT Smoke 24 hours prior to surgery  If you wear a CPAP at night please bring your mask the morning of surgery   Remember that you must have someone to transport you home after your surgery, and remain with you for 24 hours if you are discharged the same  day.   Please bring cases for contacts, glasses, hearing aids, dentures or bridgework because it cannot be worn into surgery.    Leave your suitcase in the car.  After surgery it may be brought to your room.  For patients admitted to the hospital, discharge time will be determined by your treatment team.  Patients discharged the day of surgery will not be allowed to drive home.    Special instructions:   [place]- Preparing For Surgery  Before surgery, you can play an important role. Because skin is not sterile, your skin needs to be as free of germs as possible. You can reduce the number of germs on your skin by washing with CHG (chlorahexidine gluconate) Soap before surgery.  CHG is an antiseptic cleaner which kills germs and bonds with the skin to continue killing germs even after washing.    Oral Hygiene is also important to reduce your risk of infection.  Remember - BRUSH YOUR TEETH THE MORNING OF SURGERY WITH YOUR REGULAR TOOTHPASTE  Please do not use if you have an allergy to CHG or antibacterial soaps. If your skin becomes reddened/irritated stop using the CHG.  Do not shave (including legs and underarms) for at least 48 hours prior to first CHG shower. It is OK to shave your face.  Please follow these instructions carefully.   1. Shower the NIGHT BEFORE SURGERY and the MORNING OF SURGERY with CHG Soap.  2. If you chose to wash your hair, wash your hair first as usual with your normal shampoo.  3. After you shampoo, rinse your hair and body thoroughly to remove the shampoo.  4. Use CHG as you would any other liquid soap. You can apply CHG directly to the skin and wash gently with a scrungie or a clean washcloth.   5. Apply the CHG Soap to your body ONLY FROM THE NECK DOWN.  Do not use on open wounds or open sores. Avoid contact with your eyes, ears, mouth and genitals (private parts). Wash Face and genitals (private parts)  with your normal soap.   6. Wash thoroughly,  paying special attention to the area where your surgery will be performed.  7. Thoroughly rinse your body with warm water from the neck down.  8. DO NOT shower/wash with your normal soap after using and rinsing off the CHG Soap.  9. Pat yourself dry with a CLEAN TOWEL.  10. Wear CLEAN PAJAMAS to bed the night before surgery, wear comfortable clothes the morning of surgery  11. Place CLEAN SHEETS on your bed the night of your first shower and DO NOT SLEEP WITH PETS.    Day of Surgery:  Please shower the morning of surgery with the CHG soap Do not apply any deodorants/lotions. Please wear clean clothes to the hospital/surgery center.   Remember to brush your teeth WITH YOUR REGULAR TOOTHPASTE.   Please read over the following fact sheets that you were given.

## 2020-01-10 ENCOUNTER — Other Ambulatory Visit: Payer: Self-pay

## 2020-01-10 ENCOUNTER — Encounter (HOSPITAL_COMMUNITY)
Admission: RE | Admit: 2020-01-10 | Discharge: 2020-01-10 | Disposition: A | Discharge status: Home / Self Care | Payer: Medicare Other | Source: Ambulatory Visit | Attending: Orthopedic Surgery | Admitting: Orthopedic Surgery

## 2020-01-10 ENCOUNTER — Other Ambulatory Visit (HOSPITAL_COMMUNITY)
Admission: RE | Admit: 2020-01-10 | Discharge: 2020-01-10 | Disposition: A | Discharge status: Home / Self Care | Payer: Medicare Other | Source: Ambulatory Visit | Attending: Orthopedic Surgery | Admitting: Orthopedic Surgery

## 2020-01-10 ENCOUNTER — Encounter (HOSPITAL_COMMUNITY): Payer: Self-pay

## 2020-01-10 DIAGNOSIS — G4733 Obstructive sleep apnea (adult) (pediatric): Secondary | ICD-10-CM | POA: Diagnosis not present

## 2020-01-10 DIAGNOSIS — Z01812 Encounter for preprocedural laboratory examination: Secondary | ICD-10-CM | POA: Diagnosis not present

## 2020-01-10 DIAGNOSIS — Z20822 Contact with and (suspected) exposure to covid-19: Secondary | ICD-10-CM | POA: Diagnosis not present

## 2020-01-10 DIAGNOSIS — M1712 Unilateral primary osteoarthritis, left knee: Secondary | ICD-10-CM | POA: Diagnosis not present

## 2020-01-10 DIAGNOSIS — Z79899 Other long term (current) drug therapy: Secondary | ICD-10-CM | POA: Insufficient documentation

## 2020-01-10 DIAGNOSIS — Z7901 Long term (current) use of anticoagulants: Secondary | ICD-10-CM | POA: Insufficient documentation

## 2020-01-10 DIAGNOSIS — I10 Essential (primary) hypertension: Secondary | ICD-10-CM | POA: Insufficient documentation

## 2020-01-10 DIAGNOSIS — I4819 Other persistent atrial fibrillation: Secondary | ICD-10-CM | POA: Insufficient documentation

## 2020-01-10 DIAGNOSIS — E785 Hyperlipidemia, unspecified: Secondary | ICD-10-CM | POA: Insufficient documentation

## 2020-01-10 HISTORY — DX: Cardiac arrhythmia, unspecified: I49.9

## 2020-01-10 LAB — CBC
HCT: 47.6 % (ref 39.0–52.0)
Hemoglobin: 15.9 g/dL (ref 13.0–17.0)
MCH: 31.2 pg (ref 26.0–34.0)
MCHC: 33.4 g/dL (ref 30.0–36.0)
MCV: 93.5 fL (ref 80.0–100.0)
Platelets: 212 10*3/uL (ref 150–400)
RBC: 5.09 MIL/uL (ref 4.22–5.81)
RDW: 12.4 % (ref 11.5–15.5)
WBC: 7.6 10*3/uL (ref 4.0–10.5)
nRBC: 0 % (ref 0.0–0.2)

## 2020-01-10 LAB — BASIC METABOLIC PANEL
Anion gap: 8 (ref 5–15)
BUN: 11 mg/dL (ref 8–23)
CO2: 24 mmol/L (ref 22–32)
Calcium: 8.7 mg/dL — ABNORMAL LOW (ref 8.9–10.3)
Chloride: 105 mmol/L (ref 98–111)
Creatinine, Ser: 1.01 mg/dL (ref 0.61–1.24)
GFR calc Af Amer: >60 mL/min (ref >60)
GFR calc non Af Amer: >60 mL/min (ref >60)
Glucose, Bld: 88 mg/dL (ref 70–99)
Potassium: 4.6 mmol/L (ref 3.5–5.1)
Sodium: 137 mmol/L (ref 135–145)

## 2020-01-10 LAB — URINALYSIS, ROUTINE W REFLEX MICROSCOPIC
Bilirubin Urine: NEGATIVE
Glucose, UA: NEGATIVE mg/dL
Hgb urine dipstick: NEGATIVE
Ketones, ur: NEGATIVE mg/dL
Leukocytes,Ua: NEGATIVE
Nitrite: NEGATIVE
Protein, ur: NEGATIVE mg/dL
Specific Gravity, Urine: 1.019 (ref 1.005–1.030)
pH: 6 (ref 5.0–8.0)

## 2020-01-10 LAB — SURGICAL PCR SCREEN
MRSA, PCR: NEGATIVE
Staphylococcus aureus: NEGATIVE

## 2020-01-10 NOTE — Progress Notes (Signed)
PCP:  Jonathon Jordan, MD Cardiologist:  Crissie Sickles, MD  EKG:  10/21/20 CXR:  07/19/18 ECHO:  09/17/18 Stress Test:  Denies Cardiac Cath:  Denies  Covid test: 01/10/20  Anesthesia Review: Yes, on Coumadin for aflutter.  Last dose 01/08/20 in the evening. PT/INR DOS  Patient denies shortness of breath, fever, cough, and chest pain at PAT appointment.  Patient verbalized understanding of instructions provided today at the PAT appointment.  Patient asked to review instructions at home and day of surgery.

## 2020-01-11 LAB — URINE CULTURE: Culture: NO GROWTH

## 2020-01-11 LAB — SARS CORONAVIRUS 2 (TAT 6-24 HRS): SARS Coronavirus 2: NEGATIVE

## 2020-01-11 NOTE — Anesthesia Preprocedure Evaluation (Addendum)
Anesthesia Evaluation  Patient identified by MRN, date of birth, ID band Patient awake    Reviewed: Allergy & Precautions, NPO status , Patient's Chart, lab work & pertinent test results, reviewed documented beta blocker date and time   History of Anesthesia Complications Negative for: history of anesthetic complications  Airway Mallampati: II  TM Distance: >3 FB Neck ROM: Full    Dental  (+) Teeth Intact   Pulmonary sleep apnea , former smoker,    Pulmonary exam normal        Cardiovascular hypertension, Pt. on medications and Pt. on home beta blockers Normal cardiovascular exam+ dysrhythmias Atrial Fibrillation      Neuro/Psych negative neurological ROS  negative psych ROS   GI/Hepatic negative GI ROS, (+)     substance abuse  alcohol use,   Endo/Other  negative endocrine ROS  Renal/GU negative Renal ROS  negative genitourinary   Musculoskeletal negative musculoskeletal ROS (+)   Abdominal   Peds  Hematology negative hematology ROS (+)   Anesthesia Other Findings  Last seen by Dr. Lovena Le 12/23/19 and cleared for surgery. Per note, "Persistent atrial fib - he is maintaining NSR. I have asked him to reduce his dose of amiodarone to 200 mg daily, none on Sunday. HTN - his bp has been reasonably well controlled. He will maintain a low sodium diet and is encouraged not to gain weight. Preoperative eval - he is pending knee replacement surgery. He is low risk and may proceed, stopping coumadin 5 days before surgery."  TEE 09/17/18: EF 55-60%, mild LVH, unremarkable valves  INR 1.2, plts 212 - off Warfarin >5 days  Reproductive/Obstetrics                          Anesthesia Physical Anesthesia Plan  ASA: III  Anesthesia Plan: Spinal   Post-op Pain Management:  Regional for Post-op pain   Induction:   PONV Risk Score and Plan: 1 and Propofol infusion, Treatment may vary due to age or  medical condition, Ondansetron and TIVA  Airway Management Planned: Nasal Cannula and Simple Face Mask  Additional Equipment: None  Intra-op Plan:   Post-operative Plan:   Informed Consent: I have reviewed the patients History and Physical, chart, labs and discussed the procedure including the risks, benefits and alternatives for the proposed anesthesia with the patient or authorized representative who has indicated his/her understanding and acceptance.       Plan Discussed with:   Anesthesia Plan Comments: (Follows with cardiology for hx of Afib. Last seen by Dr. Lovena Le 12/23/19 and cleared for surgery. Per note, "Persistent atrial fib - he is maintaining NSR. I have asked him to reduce his dose of amiodarone to 200 mg daily, none on Sunday. HTN - his bp has been reasonably well controlled. He will maintain a low sodium diet and is encouraged not to gain weight. Preoperative eval - he is pending knee replacement surgery. He is low risk and may proceed, stopping coumadin 5 days before surgery."  Mild to moderate OSA with AHI 14.2/hr - refused CPAP   Proep labs reviewed, unremarkable.   EKG 12/23/19: Sinus rhythm. Rate 62.  TEE 09/17/18: - Left ventricle: The cavity size was normal. Wall thickness was  increased in a pattern of mild LVH. Systolic function was normal.  The estimated ejection fraction was in the range of 55% to 60%.  Wall motion was normal; there were no regional wall motion  abnormalities.  - Aortic valve:  There was no stenosis. There was trivial  regurgitation.  - Aorta: Normal caliber aorta with minimal plaque.  - Mitral valve: There was trivial regurgitation.  - Left atrium: The atrium was moderately dilated. No evidence of  thrombus in the atrial cavity or appendage.  - Right ventricle: The cavity size was normal. Systolic function  was normal.  - Right atrium: Mild right atrial enlargement, Chiari network  noted.  - Atrial septum: No ASD/PFO  by color doppler. )      Anesthesia Quick Evaluation

## 2020-01-11 NOTE — Progress Notes (Signed)
Anesthesia Chart Review:  Follows with cardiology for hx of Afib. Last seen by Dr. Lovena Le 12/23/19 and cleared for surgery. Per note, "Persistent atrial fib - he is maintaining NSR. I have asked him to reduce his dose of amiodarone to 200 mg daily, none on Sunday. HTN - his bp has been reasonably well controlled. He will maintain a low sodium diet and is encouraged not to gain weight. Preoperative eval - he is pending knee replacement surgery. He is low risk and may proceed, stopping coumadin 5 days before surgery."  Mild to moderate OSA with AHI 14.2/hr - refused CPAP   Proep labs reviewed, unremarkable.   EKG 12/23/19: Sinus rhythm. Rate 62.  TEE 09/17/18: - Left ventricle: The cavity size was normal. Wall thickness was  increased in a pattern of mild LVH. Systolic function was normal.  The estimated ejection fraction was in the range of 55% to 60%.  Wall motion was normal; there were no regional wall motion  abnormalities.  - Aortic valve: There was no stenosis. There was trivial  regurgitation.  - Aorta: Normal caliber aorta with minimal plaque.  - Mitral valve: There was trivial regurgitation.  - Left atrium: The atrium was moderately dilated. No evidence of  thrombus in the atrial cavity or appendage.  - Right ventricle: The cavity size was normal. Systolic function  was normal.  - Right atrium: Mild right atrial enlargement, Chiari network  noted.  - Atrial septum: No ASD/PFO by color doppler.    Wynonia Musty [facility]/Anesthesiology Phone [phone] 01/11/2020 11:34 AM

## 2020-01-13 ENCOUNTER — Other Ambulatory Visit: Payer: Self-pay

## 2020-01-13 ENCOUNTER — Ambulatory Visit (HOSPITAL_COMMUNITY): Payer: Medicare Other | Admitting: Registered Nurse

## 2020-01-13 ENCOUNTER — Encounter (HOSPITAL_COMMUNITY): Payer: Self-pay | Admitting: Orthopedic Surgery

## 2020-01-13 ENCOUNTER — Observation Stay (HOSPITAL_COMMUNITY)
Admission: RE | Admit: 2020-01-13 | Discharge: 2020-01-14 | Disposition: A | Discharge status: Home / Self Care | Payer: Medicare Other | Attending: Orthopedic Surgery | Admitting: Orthopedic Surgery

## 2020-01-13 ENCOUNTER — Ambulatory Visit (HOSPITAL_COMMUNITY): Payer: Medicare Other | Admitting: Physician Assistant

## 2020-01-13 ENCOUNTER — Encounter (HOSPITAL_COMMUNITY)
Admission: RE | Disposition: A | Discharge status: Home / Self Care | Payer: Self-pay | Source: Home / Self Care | Attending: Orthopedic Surgery

## 2020-01-13 ENCOUNTER — Observation Stay (HOSPITAL_COMMUNITY): Payer: Medicare Other

## 2020-01-13 DIAGNOSIS — Z79899 Other long term (current) drug therapy: Secondary | ICD-10-CM | POA: Insufficient documentation

## 2020-01-13 DIAGNOSIS — Z96652 Presence of left artificial knee joint: Secondary | ICD-10-CM | POA: Diagnosis not present

## 2020-01-13 DIAGNOSIS — I420 Dilated cardiomyopathy: Secondary | ICD-10-CM | POA: Diagnosis not present

## 2020-01-13 DIAGNOSIS — I5042 Chronic combined systolic (congestive) and diastolic (congestive) heart failure: Secondary | ICD-10-CM | POA: Insufficient documentation

## 2020-01-13 DIAGNOSIS — Z471 Aftercare following joint replacement surgery: Secondary | ICD-10-CM | POA: Diagnosis not present

## 2020-01-13 DIAGNOSIS — E669 Obesity, unspecified: Secondary | ICD-10-CM | POA: Insufficient documentation

## 2020-01-13 DIAGNOSIS — M1712 Unilateral primary osteoarthritis, left knee: Principal | ICD-10-CM | POA: Diagnosis present

## 2020-01-13 DIAGNOSIS — I110 Hypertensive heart disease with heart failure: Secondary | ICD-10-CM | POA: Diagnosis not present

## 2020-01-13 DIAGNOSIS — Z8546 Personal history of malignant neoplasm of prostate: Secondary | ICD-10-CM | POA: Diagnosis not present

## 2020-01-13 DIAGNOSIS — R2689 Other abnormalities of gait and mobility: Secondary | ICD-10-CM | POA: Insufficient documentation

## 2020-01-13 DIAGNOSIS — Z7901 Long term (current) use of anticoagulants: Secondary | ICD-10-CM | POA: Insufficient documentation

## 2020-01-13 DIAGNOSIS — Z87891 Personal history of nicotine dependence: Secondary | ICD-10-CM | POA: Diagnosis not present

## 2020-01-13 DIAGNOSIS — Z6832 Body mass index (BMI) 32.0-32.9, adult: Secondary | ICD-10-CM | POA: Diagnosis not present

## 2020-01-13 DIAGNOSIS — M6281 Muscle weakness (generalized): Secondary | ICD-10-CM | POA: Diagnosis not present

## 2020-01-13 DIAGNOSIS — G4733 Obstructive sleep apnea (adult) (pediatric): Secondary | ICD-10-CM | POA: Insufficient documentation

## 2020-01-13 DIAGNOSIS — N400 Enlarged prostate without lower urinary tract symptoms: Secondary | ICD-10-CM | POA: Insufficient documentation

## 2020-01-13 DIAGNOSIS — E785 Hyperlipidemia, unspecified: Secondary | ICD-10-CM | POA: Diagnosis not present

## 2020-01-13 DIAGNOSIS — I4892 Unspecified atrial flutter: Secondary | ICD-10-CM | POA: Insufficient documentation

## 2020-01-13 DIAGNOSIS — I4891 Unspecified atrial fibrillation: Secondary | ICD-10-CM | POA: Diagnosis not present

## 2020-01-13 DIAGNOSIS — Z9889 Other specified postprocedural states: Secondary | ICD-10-CM

## 2020-01-13 DIAGNOSIS — G8918 Other acute postprocedural pain: Secondary | ICD-10-CM | POA: Diagnosis not present

## 2020-01-13 HISTORY — PX: PARTIAL KNEE ARTHROPLASTY: SHX2174

## 2020-01-13 LAB — PROTIME-INR
INR: 1.2 (ref 0.8–1.2)
Prothrombin Time: 15.2 seconds (ref 11.4–15.2)

## 2020-01-13 SURGERY — ARTHROPLASTY, KNEE, UNICOMPARTMENTAL
Anesthesia: Regional | Site: Knee | Laterality: Left

## 2020-01-13 MED ORDER — LIDOCAINE 2% (20 MG/ML) 5 ML SYRINGE
INTRAMUSCULAR | Status: AC
  Filled 2020-01-13: qty 5

## 2020-01-13 MED ORDER — POVIDONE-IODINE 10 % EX SWAB
2.0000 "application " | Freq: Once | CUTANEOUS | Status: AC
  Administered 2020-01-13: 2 via TOPICAL

## 2020-01-13 MED ORDER — BUPIVACAINE HCL (PF) 0.25 % IJ SOLN
INTRAMUSCULAR | Status: AC
  Filled 2020-01-13: qty 30

## 2020-01-13 MED ORDER — CLONIDINE HCL (ANALGESIA) 100 MCG/ML EP SOLN
EPIDURAL | Status: AC
  Filled 2020-01-13: qty 10

## 2020-01-13 MED ORDER — METOCLOPRAMIDE HCL 5 MG PO TABS: 5.0000 mg | ORAL_TABLET | Freq: Three times a day (TID) | ORAL | Status: DC | PRN

## 2020-01-13 MED ORDER — LIDOCAINE 2% (20 MG/ML) 5 ML SYRINGE
INTRAMUSCULAR | Status: DC | PRN
  Administered 2020-01-13: 20 mg via INTRAVENOUS

## 2020-01-13 MED ORDER — LACTATED RINGERS IV SOLN: INTRAVENOUS | Status: DC

## 2020-01-13 MED ORDER — MIDAZOLAM HCL 2 MG/2ML IJ SOLN: 1.0000 mg | Freq: Once | INTRAMUSCULAR | Status: AC

## 2020-01-13 MED ORDER — ACETAMINOPHEN 325 MG PO TABS
325.0000 mg | ORAL_TABLET | Freq: Four times a day (QID) | ORAL | Status: DC | PRN
  Filled 2020-01-13: qty 2

## 2020-01-13 MED ORDER — PROPOFOL 500 MG/50ML IV EMUL
INTRAVENOUS | Status: DC | PRN
  Administered 2020-01-13: 75 ug/kg/min via INTRAVENOUS
  Administered 2020-01-13: 100 ug/kg/min via INTRAVENOUS

## 2020-01-13 MED ORDER — PHENYLEPHRINE 40 MCG/ML (10ML) SYRINGE FOR IV PUSH (FOR BLOOD PRESSURE SUPPORT)
PREFILLED_SYRINGE | INTRAVENOUS | Status: AC
  Filled 2020-01-13: qty 10

## 2020-01-13 MED ORDER — ONDANSETRON HCL 4 MG/2ML IJ SOLN: 4.0000 mg | Freq: Four times a day (QID) | INTRAMUSCULAR | Status: DC | PRN

## 2020-01-13 MED ORDER — DEXAMETHASONE SODIUM PHOSPHATE 10 MG/ML IJ SOLN
INTRAMUSCULAR | Status: DC | PRN
  Administered 2020-01-13: 5 mg via INTRAVENOUS

## 2020-01-13 MED ORDER — CLONIDINE HCL (ANALGESIA) 100 MCG/ML EP SOLN
EPIDURAL | Status: DC | PRN
  Administered 2020-01-13: 8 mL via INTRA_ARTICULAR

## 2020-01-13 MED ORDER — TRANEXAMIC ACID-NACL 1000-0.7 MG/100ML-% IV SOLN
INTRAVENOUS | Status: AC
  Filled 2020-01-13: qty 100

## 2020-01-13 MED ORDER — ROPIVACAINE HCL 5 MG/ML IJ SOLN
INTRAMUSCULAR | Status: DC | PRN
  Administered 2020-01-13: 30 mL via PERINEURAL

## 2020-01-13 MED ORDER — WARFARIN - PHARMACIST DOSING INPATIENT: Freq: Every day | Status: DC

## 2020-01-13 MED ORDER — BUPIVACAINE HCL (PF) 0.25 % IJ SOLN
INTRAMUSCULAR | Status: DC | PRN
  Administered 2020-01-13: 30 mL

## 2020-01-13 MED ORDER — METOCLOPRAMIDE HCL 5 MG/ML IJ SOLN: 5.0000 mg | Freq: Three times a day (TID) | INTRAMUSCULAR | Status: DC | PRN

## 2020-01-13 MED ORDER — AMIODARONE HCL 200 MG PO TABS: 200.0000 mg | ORAL_TABLET | ORAL | Status: DC

## 2020-01-13 MED ORDER — PROPOFOL 10 MG/ML IV BOLUS
INTRAVENOUS | Status: AC
  Filled 2020-01-13: qty 20

## 2020-01-13 MED ORDER — METOPROLOL TARTRATE 25 MG PO TABS
25.0000 mg | ORAL_TABLET | Freq: Every day | ORAL | Status: DC
  Administered 2020-01-14: 25 mg via ORAL
  Filled 2020-01-13: qty 1

## 2020-01-13 MED ORDER — TRANEXAMIC ACID-NACL 1000-0.7 MG/100ML-% IV SOLN
1000.0000 mg | INTRAVENOUS | Status: AC
  Administered 2020-01-13: 12:00:00 1000 mg via INTRAVENOUS

## 2020-01-13 MED ORDER — PHENOL 1.4 % MT LIQD: 1.0000 | OROMUCOSAL | Status: DC | PRN

## 2020-01-13 MED ORDER — OXYCODONE HCL 5 MG PO TABS
5.0000 mg | ORAL_TABLET | ORAL | Status: DC | PRN
  Administered 2020-01-14: 5 mg via ORAL
  Administered 2020-01-14: 10 mg via ORAL
  Administered 2020-01-14: 5 mg via ORAL
  Filled 2020-01-13: qty 2
  Filled 2020-01-13 (×2): qty 1

## 2020-01-13 MED ORDER — GABAPENTIN 300 MG PO CAPS
300.0000 mg | ORAL_CAPSULE | Freq: Three times a day (TID) | ORAL | Status: DC
  Administered 2020-01-14: 300 mg via ORAL
  Filled 2020-01-13: qty 1

## 2020-01-13 MED ORDER — ONDANSETRON HCL 4 MG/2ML IJ SOLN
INTRAMUSCULAR | Status: AC
  Filled 2020-01-13: qty 2

## 2020-01-13 MED ORDER — VANCOMYCIN HCL 1000 MG IV SOLR
INTRAVENOUS | Status: DC | PRN
  Administered 2020-01-13: 1000 mg

## 2020-01-13 MED ORDER — ONDANSETRON HCL 4 MG/2ML IJ SOLN
INTRAMUSCULAR | Status: DC | PRN
  Administered 2020-01-13: 4 mg via INTRAVENOUS

## 2020-01-13 MED ORDER — HYDROMORPHONE HCL 1 MG/ML IJ SOLN: 0.5000 mg | INTRAMUSCULAR | Status: DC | PRN

## 2020-01-13 MED ORDER — AMIODARONE HCL 200 MG PO TABS: 200.0000 mg | ORAL_TABLET | Freq: Once | ORAL | Status: DC

## 2020-01-13 MED ORDER — ONDANSETRON HCL 4 MG PO TABS: 4.0000 mg | ORAL_TABLET | Freq: Four times a day (QID) | ORAL | Status: DC | PRN

## 2020-01-13 MED ORDER — CEFAZOLIN SODIUM-DEXTROSE 2-4 GM/100ML-% IV SOLN
2.0000 g | Freq: Four times a day (QID) | INTRAVENOUS | Status: AC
  Administered 2020-01-13 (×2): 2 g via INTRAVENOUS
  Filled 2020-01-13 (×2): qty 100

## 2020-01-13 MED ORDER — PHENYLEPHRINE HCL (PRESSORS) 10 MG/ML IV SOLN
INTRAVENOUS | Status: AC
  Filled 2020-01-13: qty 1

## 2020-01-13 MED ORDER — MENTHOL 3 MG MT LOZG
1.0000 | LOZENGE | OROMUCOSAL | Status: DC | PRN
  Administered 2020-01-13: 3 mg via ORAL
  Filled 2020-01-13: qty 9

## 2020-01-13 MED ORDER — ENOXAPARIN SODIUM 30 MG/0.3ML ~~LOC~~ SOLN
30.0000 mg | SUBCUTANEOUS | Status: DC
  Filled 2020-01-13: qty 0.3

## 2020-01-13 MED ORDER — MIDAZOLAM HCL 2 MG/2ML IJ SOLN
INTRAMUSCULAR | Status: AC
  Administered 2020-01-13: 1 mg via INTRAVENOUS
  Filled 2020-01-13: qty 2

## 2020-01-13 MED ORDER — CEFAZOLIN SODIUM-DEXTROSE 2-4 GM/100ML-% IV SOLN
INTRAVENOUS | Status: AC
  Filled 2020-01-13: qty 100

## 2020-01-13 MED ORDER — VANCOMYCIN HCL 1000 MG IV SOLR
INTRAVENOUS | Status: AC
  Filled 2020-01-13: qty 1000

## 2020-01-13 MED ORDER — FENTANYL CITRATE (PF) 100 MCG/2ML IJ SOLN
INTRAMUSCULAR | Status: AC
  Administered 2020-01-13: 50 ug via INTRAVENOUS
  Filled 2020-01-13: qty 2

## 2020-01-13 MED ORDER — MORPHINE SULFATE (PF) 4 MG/ML IV SOLN
INTRAVENOUS | Status: AC
  Filled 2020-01-13: qty 1

## 2020-01-13 MED ORDER — MORPHINE SULFATE (PF) 4 MG/ML IV SOLN
INTRAVENOUS | Status: DC | PRN
  Administered 2020-01-13 (×2): 4 mg

## 2020-01-13 MED ORDER — PHENYLEPHRINE 40 MCG/ML (10ML) SYRINGE FOR IV PUSH (FOR BLOOD PRESSURE SUPPORT)
PREFILLED_SYRINGE | INTRAVENOUS | Status: DC | PRN
  Administered 2020-01-13: 120 ug via INTRAVENOUS

## 2020-01-13 MED ORDER — BUPIVACAINE LIPOSOME 1.3 % IJ SUSP
INTRAMUSCULAR | Status: DC | PRN
  Administered 2020-01-13: 20 mL

## 2020-01-13 MED ORDER — FENTANYL CITRATE (PF) 100 MCG/2ML IJ SOLN: 50.0000 ug | Freq: Once | INTRAMUSCULAR | Status: AC

## 2020-01-13 MED ORDER — DEXAMETHASONE SODIUM PHOSPHATE 10 MG/ML IJ SOLN
INTRAMUSCULAR | Status: AC
  Filled 2020-01-13: qty 1

## 2020-01-13 MED ORDER — CHLORHEXIDINE GLUCONATE 4 % EX LIQD: 60.0000 mL | Freq: Once | CUTANEOUS | Status: DC

## 2020-01-13 MED ORDER — CEFAZOLIN SODIUM-DEXTROSE 2-4 GM/100ML-% IV SOLN
2.0000 g | INTRAVENOUS | Status: AC
  Administered 2020-01-13: 12:00:00 2 g via INTRAVENOUS

## 2020-01-13 MED ORDER — DOCUSATE SODIUM 100 MG PO CAPS
100.0000 mg | ORAL_CAPSULE | Freq: Two times a day (BID) | ORAL | Status: DC
  Administered 2020-01-14: 100 mg via ORAL
  Filled 2020-01-13: qty 1

## 2020-01-13 MED ORDER — METHOCARBAMOL 1000 MG/10ML IJ SOLN
500.0000 mg | Freq: Four times a day (QID) | INTRAVENOUS | Status: DC | PRN
  Filled 2020-01-13: qty 5

## 2020-01-13 MED ORDER — BUPIVACAINE LIPOSOME 1.3 % IJ SUSP
20.0000 mL | Freq: Once | INTRAMUSCULAR | Status: DC
  Filled 2020-01-13: qty 20

## 2020-01-13 MED ORDER — BUPIVACAINE IN DEXTROSE 0.75-8.25 % IT SOLN
INTRATHECAL | Status: DC | PRN
  Administered 2020-01-13: 2 mL via INTRATHECAL

## 2020-01-13 MED ORDER — PROPOFOL 10 MG/ML IV BOLUS
INTRAVENOUS | Status: DC | PRN
  Administered 2020-01-13: 30 mg via INTRAVENOUS

## 2020-01-13 MED ORDER — PROPOFOL 1000 MG/100ML IV EMUL
INTRAVENOUS | Status: AC
  Filled 2020-01-13: qty 100

## 2020-01-13 MED ORDER — 0.9 % SODIUM CHLORIDE (POUR BTL) OPTIME
TOPICAL | Status: DC | PRN
  Administered 2020-01-13 (×5): 1000 mL

## 2020-01-13 MED ORDER — METHOCARBAMOL 500 MG PO TABS
500.0000 mg | ORAL_TABLET | Freq: Four times a day (QID) | ORAL | Status: DC | PRN
  Filled 2020-01-13: qty 1

## 2020-01-13 MED ORDER — WARFARIN SODIUM 7.5 MG PO TABS
7.5000 mg | ORAL_TABLET | Freq: Once | ORAL | Status: DC
  Filled 2020-01-13: qty 1

## 2020-01-13 MED ORDER — FENTANYL CITRATE (PF) 100 MCG/2ML IJ SOLN
INTRAMUSCULAR | Status: DC | PRN
  Administered 2020-01-13: 25 ug via INTRAVENOUS

## 2020-01-13 MED ORDER — FENTANYL CITRATE (PF) 250 MCG/5ML IJ SOLN
INTRAMUSCULAR | Status: AC
  Filled 2020-01-13: qty 5

## 2020-01-13 MED ORDER — PHENYLEPHRINE HCL-NACL 10-0.9 MG/250ML-% IV SOLN
INTRAVENOUS | Status: DC | PRN
  Administered 2020-01-13: 25 ug/min via INTRAVENOUS

## 2020-01-13 SURGICAL SUPPLY — 57 items
BANDAGE ESMARK 6X9 LF (GAUZE/BANDAGES/DRESSINGS) ×1 IMPLANT
BEARING TIBIAL SZ 3 MED 3 (Knees) ×2 IMPLANT
BNDG COHESIVE 6X5 TAN STRL LF (GAUZE/BANDAGES/DRESSINGS) ×2 IMPLANT
BNDG ELASTIC 6X15 VLCR STRL LF (GAUZE/BANDAGES/DRESSINGS) ×2 IMPLANT
BNDG ESMARK 6X9 LF (GAUZE/BANDAGES/DRESSINGS) ×2
BOWL SMART MIX CTS (DISPOSABLE) ×2 IMPLANT
COVER SURGICAL LIGHT HANDLE (MISCELLANEOUS) ×2 IMPLANT
CUFF TOURN SGL QUICK 34 (TOURNIQUET CUFF) ×1
CUFF TRNQT CYL 34X4.125X (TOURNIQUET CUFF) ×1 IMPLANT
DRAPE IMP U-DRAPE 54X76 (DRAPES) ×2 IMPLANT
DRAPE INCISE IOBAN 66X45 STRL (DRAPES) IMPLANT
DRAPE ORTHO SPLIT 77X108 STRL (DRAPES) ×2
DRAPE SURG ORHT 6 SPLT 77X108 (DRAPES) ×2 IMPLANT
DRAPE U-SHAPE 47X51 STRL (DRAPES) ×2 IMPLANT
DURAPREP 26ML APPLICATOR (WOUND CARE) ×2 IMPLANT
ELECT REM PT RETURN 9FT ADLT (ELECTROSURGICAL) ×2
ELECTRODE REM PT RTRN 9FT ADLT (ELECTROSURGICAL) ×1 IMPLANT
FACESHIELD WRAPAROUND (MASK) ×4 IMPLANT
GLOVE BIOGEL PI IND STRL 8 (GLOVE) ×1 IMPLANT
GLOVE BIOGEL PI INDICATOR 8 (GLOVE) ×1
GLOVE ECLIPSE 8.0 STRL XLNG CF (GLOVE) ×2 IMPLANT
GOWN STRL REUS W/ TWL LRG LVL3 (GOWN DISPOSABLE) ×3 IMPLANT
GOWN STRL REUS W/ TWL XL LVL3 (GOWN DISPOSABLE) ×1 IMPLANT
GOWN STRL REUS W/TWL LRG LVL3 (GOWN DISPOSABLE) ×3
GOWN STRL REUS W/TWL XL LVL3 (GOWN DISPOSABLE) ×1
HANDPIECE INTERPULSE COAX TIP (DISPOSABLE) ×1
HOOD PEEL AWAY FACE SHEILD DIS (HOOD) ×6 IMPLANT
KIT BASIN OR (CUSTOM PROCEDURE TRAY) ×2 IMPLANT
KIT TURNOVER KIT B (KITS) ×2 IMPLANT
MANIFOLD NEPTUNE II (INSTRUMENTS) ×2 IMPLANT
NEEDLE 18GX1X1/2 (RX/OR ONLY) (NEEDLE) ×2 IMPLANT
NEEDLE HYPO 22GX1.5 SAFETY (NEEDLE) ×4 IMPLANT
NS IRRIG 1000ML POUR BTL (IV SOLUTION) ×2 IMPLANT
PACK BLADE SAW RECIP 70 3 PT (BLADE) ×2 IMPLANT
PACK TOTAL JOINT (CUSTOM PROCEDURE TRAY) ×2 IMPLANT
PACK UNIVERSAL I (CUSTOM PROCEDURE TRAY) ×2 IMPLANT
PAD ARMBOARD 7.5X6 YLW CONV (MISCELLANEOUS) ×4 IMPLANT
PADDING CAST ABS 6INX4YD NS (CAST SUPPLIES) ×2
PADDING CAST ABS COTTON 6X4 NS (CAST SUPPLIES) ×2 IMPLANT
PEG TWIN FEM CEMENTED MED (Knees) ×2 IMPLANT
SET HNDPC FAN SPRY TIP SCT (DISPOSABLE) ×1 IMPLANT
SOL PREP POV-IOD 4OZ 10% (MISCELLANEOUS) ×2 IMPLANT
SUCTION FRAZIER HANDLE 10FR (MISCELLANEOUS) ×1
SUCTION TUBE FRAZIER 10FR DISP (MISCELLANEOUS) ×1 IMPLANT
SUT MNCRL AB 3-0 PS2 27 (SUTURE) ×2 IMPLANT
SUT VIC AB 0 CT1 27 (SUTURE) ×3
SUT VIC AB 0 CT1 27XBRD ANBCTR (SUTURE) ×3 IMPLANT
SUT VIC AB 1 CT1 27 (SUTURE) ×5
SUT VIC AB 1 CT1 27XBRD ANBCTR (SUTURE) ×5 IMPLANT
SUT VIC AB 2-0 CT1 27 (SUTURE) ×3
SUT VIC AB 2-0 CT1 TAPERPNT 27 (SUTURE) ×3 IMPLANT
SYR 30ML LL (SYRINGE) ×6 IMPLANT
TOWEL GREEN STERILE (TOWEL DISPOSABLE) ×4 IMPLANT
TOWEL GREEN STERILE FF (TOWEL DISPOSABLE) ×4 IMPLANT
TRAY FOLEY MTR SLVR 16FR STAT (SET/KITS/TRAYS/PACK) IMPLANT
TRAY FOLEY W/BAG SLVR 14FR (SET/KITS/TRAYS/PACK) ×2 IMPLANT
TRAY TIBIAL OXFORD SZ D LF (Joint) ×2 IMPLANT

## 2020-01-13 NOTE — Progress Notes (Signed)
Pt received from PACU;alert & oriented x4 ;denies pain at this time.Left leg dressing intact  - ace wrap with a knee immobilizer

## 2020-01-13 NOTE — Anesthesia Procedure Notes (Signed)
Spinal  Patient location during procedure: OR Staffing Performed: anesthesiologist  Anesthesiologist: Maimuna Leaman E, MD Preanesthetic Checklist Completed: patient identified, IV checked, risks and benefits discussed, surgical consent, monitors and equipment checked, pre-op evaluation and timeout performed Spinal Block Patient position: sitting Prep: DuraPrep and site prepped and draped Patient monitoring: continuous pulse ox, blood pressure and heart rate Approach: midline Location: L3-4 Injection technique: single-shot Needle Needle type: Pencan  Needle gauge: 24 G Needle length: 9 cm Additional Notes Functioning IV was confirmed and monitors were applied. Sterile prep and drape, including hand hygiene and sterile gloves were used. The patient was positioned and the spine was prepped. The skin was anesthetized with lidocaine.  Free flow of clear CSF was obtained prior to injecting local anesthetic into the CSF. The needle was carefully withdrawn. The patient tolerated the procedure well.      

## 2020-01-13 NOTE — Progress Notes (Signed)
ANTICOAGULATION CONSULT NOTE - Initial Consult  Pharmacy Consult for Coumadin Indication: atrial fibrillation  No Known Allergies  Patient Measurements: Height: 5\' 10"  (177.8 cm) Weight: 225 lb (102.1 kg) IBW/kg (Calculated) : 73  Vital Signs: Temp: 98.6 F (37 C) (03/04 1004) Temp Source: Oral (03/04 1004) BP: 154/87 (03/04 1140) Pulse Rate: 53 (03/04 1140)  Labs: Recent Labs    01/13/20 1036  LABPROT 15.2  INR 1.2    Estimated Creatinine Clearance: 75.6 mL/min (by C-G formula based on SCr of 1.01 mg/dL).   Medical History: Past Medical History:  Diagnosis Date  . Arthritis    "left knee; right shoulder" (04/19/2015)  . Atrial fibrillation ([place])   . Atrial flutter [facility])    s/p ablation by Dr. Lovena Le  . BPH (benign prostatic hyperplasia)   . DCM (dilated cardiomyopathy) ([place]) 12/26/2016   EF 49% by cardiac MRI  . Diverticulosis of colon (without mention of hemorrhage)   . Dysrhythmia   . Habitual alcohol use   . HLD (hyperlipidemia)   . Hypertension   . Obesity   . OSA (obstructive sleep apnea) 04/27/2015   Mild to moderate with AHI 14.2/hr - refused CPAP  . Prostate cancer [facility])     Assessment:  CC/HPI: L knee OA>>L TKR 01/13/20  PMH: arthritis, Afib, aflutter, dCM, OSA, CHF, alcohol, obesity, HLD, HTN, prostate cancer 2013  Goal of Therapy:  INR 2-3   Plan:  Coumadin 7.5mg  po x 1 tonight Daily INR Lovenox 30mg /d for now per Dr. Marlou Sa until INR>2  Kamari Buch S. Alford Highland, PharmD, BCPS Clinical Staff Pharmacist [email or web address]  Alford Highland, Kavin Weckwerth Stilling[place] 01/13/2020,3:45 PM

## 2020-01-13 NOTE — Anesthesia Procedure Notes (Signed)
Procedure Name: MAC Date/Time: 01/13/2020 11:56 AM Performed by: Trinna Post., CRNA Pre-anesthesia Checklist: Patient identified, Emergency Drugs available, Suction available, Patient being monitored and Timeout performed Patient Re-evaluated:Patient Re-evaluated prior to induction Oxygen Delivery Method: Simple face mask Preoxygenation: Pre-oxygenation with 100% oxygen Induction Type: IV induction Placement Confirmation: positive ETCO2
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
## 2020-01-13 NOTE — Progress Notes (Signed)
Orthopedic Tech Progress Note Patient Details:  Travis Chase 13-Jan-1946 [id number]      Post Interventions Patient Tolerated: Well Instructions Provided: Care of [place] 01/13/2020, 7:41 PM

## 2020-01-13 NOTE — Brief Op Note (Signed)
   01/13/2020  3:04 PM  PATIENT:  [name]  74 y.o. male  PRE-OPERATIVE DIAGNOSIS:  left knee osteoarthritis  POST-OPERATIVE DIAGNOSIS:  left knee osteoarthritis  PROCEDURE:  Procedure(s): LEFT UNICOMPARTMENTAL KNEE REPLACEMENT  SURGEON:  Surgeon(s): Meredith Pel, MD  ASSISTANT: magnant pa  ANESTHESIA:   spinal  EBL: 100 ml    Total I/O In: 1000 [I.V.:1000] Out: 150 [Blood:150]  BLOOD ADMINISTERED: none  DRAINS: none   LOCAL MEDICATIONS USED: marcaine mso4 clonidine exparel  SPECIMEN:  No Specimen  COUNTS:  YES  TOURNIQUET:   Total Tourniquet Time Documented: Thigh (Left) - 105 minutes Total: Thigh (Left) - 105 minutes   DICTATION: .Other Dictation: Dictation Number [id number]  PLAN OF CARE: Admit for overnight observation  PATIENT DISPOSITION:  PACU - hemodynamically stable

## 2020-01-13 NOTE — Anesthesia Procedure Notes (Signed)
Anesthesia Regional Block: Adductor canal block   Pre-Anesthetic Checklist: ,, timeout performed, Correct Patient, Correct Site, Correct Laterality, Correct Procedure, Correct Position, site marked, Risks and benefits discussed,  Surgical consent,  Pre-op evaluation,  At surgeon's request and post-op pain management  Laterality: Left  Prep: chloraprep       Needles:  Injection technique: Single-shot  Needle Type: Echogenic Stimulator Needle     Needle Length: 10cm  Needle Gauge: 21     Additional Needles:   Procedures:,,,, ultrasound used (permanent image in chart),,,,  Narrative:  Start time: 01/13/2020 11:32 AM End time: 01/13/2020 11:38 AM Injection made incrementally with aspirations every 5 mL.  Performed by: Personally  Anesthesiologist: Lidia Collum, MD  Additional Notes: Monitors applied. Injection made in 5cc increments. No resistance to injection. Good needle visualization. Patient tolerated procedure well.

## 2020-01-13 NOTE — Op Note (Signed)
NAME: Travis Chase, Travis Chase MEDICAL RECORD [id number] ACCOUNT [id number] DATE OF BIRTH:1946-07-25 FACILITY: [facility] LOCATION: [facility] PHYSICIAN:Coral Soler Randel Pigg, MD  OPERATIVE REPORT  DATE OF PROCEDURE:  01/13/2020  PREOPERATIVE DIAGNOSIS:  Left knee medial compartment arthritis.  POSTOPERATIVE DIAGNOSIS:  Left knee medial compartment arthritis.  PROCEDURE:  Left knee unicompartmental knee replacement using Biomet Oxford partial knee, cemented medium size femoral component, medium size tibial tray, medium size 3 mm anatomic meniscal bearing.  SURGEON:  Meredith Pel, MD  ASSISTANT:  Annie Main, PA  INDICATIONS:  The patient is a 74 year old patient with left knee medial-sided pain only with anteromedial arthritis on radiographs and clinical history consistent with medial compartment arthritis.  He presents for operative management after explanation  of risks and benefits.  PROCEDURE IN DETAIL:  The patient was brought to the operating room where a spinal anesthetic was induced.  Preoperative antibiotics administered.  Timeout was called.  The left leg was prescrubbed with alcohol and Betadine, allowed to air dry,  prepped  with DuraPrep solution and draped in a sterile manner.  Ioban used to cover the operative field.  Timeout was called.  The left leg was elevated and exsanguinated with the Esmarch wrap.  Tourniquet was inflated to 300 mmHg.  Total tourniquet time 105  minutes.  Anterior approach to knee was made.  Skin and subcutaneous tissue were sharply divided.  Median parapatellar approach was made.  Fat pad was partially excised.  Compartments were inspected and the patient did have an anteromedial arthritis  affecting the medial femoral condyle and medial tibial plateau.  The patellofemoral compartment was intact and the lateral compartment was also intact.  Decision made at this time to proceed with unicompartmental knee replacement.  The tibial cut was  then made first in the  appropriate alignment with the collaterals protected.  A medium tray was chosen.  The size 4 shim was placed and then the G clamp was attached to the tibial guide.  Two guide pins were placed and the tibia was then cut.  Bone  quality was excellent.  Next, the femur was sized.  Initial reaming was performed.  Next, alignment on the femur was chosen.  Intramedullary alignment was utilized.  The guide was placed with the center line within the sites of the drill holes.  Drill  holes were made.  This was centered on the condyle with the knee in flexion.  Next, the guide pin was placed into the femoral condyle and reaming was performed.  Next, the posterior cut was made and sizing was performed.  Appropriate balancing was  achieved through a combination of checking the flexion and extension gaps.  Once the appropriate size was achieved, which was a size 3 meniscal bearing with medium femur, the preparation on the femur and tibia was complete.  The meniscus was excised.   Next, the trial components were placed after femoral preparation was completed.  The patient achieved full extension and full flexion with good meniscal bearing stability with a size 3.  The patient maintained generally neutral alignment.  Trial  components were removed.  Thorough irrigation was performed.  Capsule was anesthetized using Marcaine, Exparel and saline.  Irrisept was utilized at all times  after the incision, as well as after the arthrotomy to prevent infection.  True components  were cemented in with excess cement removed.  Size 3 trial was then again placed and found to be very good with good tracking and good stability.  True component size 3  was placed.  Two-finger tightness achieved with both insertion and extraction of the  trial.  This was definitely the correct size.  Next, the tourniquet was released.  Bleeding points encountered controlled using electrocautery.  Thorough irrigation performed again using pouring irrigation.   Vancomycin powder placed within the incision.   Arthrotomy closed over a bolster using #1 Vicryl suture, followed by interrupted inverted 0 Vicryl suture, 2-0 Vicryl suture and a 3-0 Monocryl.  Steri-Strips, Aquacel dressing applied.  A solution of Marcaine, morphine, clonidine injected into the knee  for postop pain relief.  The patient tolerated the procedure well without immediate complications.  He was transferred to the recovery room in stable condition.  Luke's assistance was required at all times for retraction, opening and closing.  His  assistance was a medical necessity.  VN/NUANCE  D:01/13/2020 T:01/13/2020 JOB:[id number]

## 2020-01-13 NOTE — Transfer of Care (Signed)
Immediate Anesthesia Transfer of Care Note  Patient: Travis Chase  Procedure(s) Performed: LEFT UNICOMPARTMENTAL KNEE REPLACEMENT (Left Knee)  Patient Location: PACU  Anesthesia Type:Regional and Spinal  Level of Consciousness: awake, alert  and oriented  Airway & Oxygen Therapy: Patient Spontanous Breathing and Patient connected to face mask oxygen  Post-op Assessment: Report given to RN and Post -op Vital signs reviewed and stable  Post vital signs: Reviewed and stable  Last Vitals:  Vitals Value Taken Time  BP 121/71 01/13/20 1533  Temp    Pulse 55 01/13/20 1535  Resp 13 01/13/20 1535  SpO2 100 % 01/13/20 1535  Vitals shown include unvalidated device data.  Last Pain:  Vitals:   01/13/20 1011  TempSrc:   PainSc: 1       Patients Stated Pain Goal: 3 ([id number])  Complications: No apparent anesthesia complications

## 2020-01-13 NOTE — Progress Notes (Signed)
Orthopedic Tech Progress Note Patient Details:  Travis Chase 1946/02/06 [id number]  CPM Left Knee CPM Left Knee: On Left Knee Flexion (Degrees): 40 Left Knee Extension (Degrees): 10  Post Interventions Patient Tolerated: Well Instructions Provided: Care of device  Staci Righter 01/13/2020, 7:53 PM

## 2020-01-13 NOTE — Anesthesia Postprocedure Evaluation (Signed)
Anesthesia Post Note  Patient: Travis Chase  Procedure(s) Performed: LEFT UNICOMPARTMENTAL KNEE REPLACEMENT (Left Knee)     Patient location during evaluation: PACU Anesthesia Type: Spinal Level of consciousness: oriented and awake and alert Pain management: pain level controlled Vital Signs Assessment: post-procedure vital signs reviewed and stable Respiratory status: spontaneous breathing, respiratory function stable, nonlabored ventilation and patient connected to nasal cannula oxygen Cardiovascular status: blood pressure returned to baseline and stable Postop Assessment: no headache, no backache, no apparent nausea or vomiting and spinal receding Anesthetic complications: no    Last Vitals:  Vitals:   01/13/20 1135 01/13/20 1140  BP: (!) 153/85 (!) 154/87  Pulse: (!) 56 (!) 53  Resp: 14 13  Temp:    SpO2: 99% 98%    Last Pain:  Vitals:   01/13/20 1011  TempSrc:   PainSc: 1                  Lidia Collum

## 2020-01-14 ENCOUNTER — Encounter: Payer: Self-pay | Admitting: *Deleted

## 2020-01-14 ENCOUNTER — Other Ambulatory Visit: Payer: Self-pay

## 2020-01-14 DIAGNOSIS — E669 Obesity, unspecified: Secondary | ICD-10-CM | POA: Diagnosis not present

## 2020-01-14 DIAGNOSIS — G8929 Other chronic pain: Secondary | ICD-10-CM

## 2020-01-14 DIAGNOSIS — E785 Hyperlipidemia, unspecified: Secondary | ICD-10-CM | POA: Diagnosis not present

## 2020-01-14 DIAGNOSIS — Z87891 Personal history of nicotine dependence: Secondary | ICD-10-CM | POA: Diagnosis not present

## 2020-01-14 DIAGNOSIS — I110 Hypertensive heart disease with heart failure: Secondary | ICD-10-CM | POA: Diagnosis not present

## 2020-01-14 DIAGNOSIS — M1712 Unilateral primary osteoarthritis, left knee: Secondary | ICD-10-CM | POA: Diagnosis not present

## 2020-01-14 DIAGNOSIS — I5042 Chronic combined systolic (congestive) and diastolic (congestive) heart failure: Secondary | ICD-10-CM | POA: Diagnosis not present

## 2020-01-14 DIAGNOSIS — Z6832 Body mass index (BMI) 32.0-32.9, adult: Secondary | ICD-10-CM | POA: Diagnosis not present

## 2020-01-14 DIAGNOSIS — G4733 Obstructive sleep apnea (adult) (pediatric): Secondary | ICD-10-CM | POA: Diagnosis not present

## 2020-01-14 DIAGNOSIS — I4892 Unspecified atrial flutter: Secondary | ICD-10-CM | POA: Diagnosis not present

## 2020-01-14 DIAGNOSIS — I420 Dilated cardiomyopathy: Secondary | ICD-10-CM | POA: Diagnosis not present

## 2020-01-14 DIAGNOSIS — N400 Enlarged prostate without lower urinary tract symptoms: Secondary | ICD-10-CM | POA: Diagnosis not present

## 2020-01-14 DIAGNOSIS — Z79899 Other long term (current) drug therapy: Secondary | ICD-10-CM | POA: Diagnosis not present

## 2020-01-14 DIAGNOSIS — M6281 Muscle weakness (generalized): Secondary | ICD-10-CM | POA: Diagnosis not present

## 2020-01-14 DIAGNOSIS — I4891 Unspecified atrial fibrillation: Secondary | ICD-10-CM | POA: Diagnosis not present

## 2020-01-14 DIAGNOSIS — Z8546 Personal history of malignant neoplasm of prostate: Secondary | ICD-10-CM | POA: Diagnosis not present

## 2020-01-14 DIAGNOSIS — Z7901 Long term (current) use of anticoagulants: Secondary | ICD-10-CM | POA: Diagnosis not present

## 2020-01-14 LAB — PROTIME-INR
INR: 1.2 (ref 0.8–1.2)
Prothrombin Time: 14.7 seconds (ref 11.4–15.2)

## 2020-01-14 MED ORDER — ENOXAPARIN SODIUM 30 MG/0.3ML ~~LOC~~ SOLN: 30.0000 mg | SUBCUTANEOUS | 0 refills | Status: DC

## 2020-01-14 MED ORDER — ENOXAPARIN SODIUM 40 MG/0.4ML ~~LOC~~ SOLN
40.0000 mg | SUBCUTANEOUS | Status: DC
  Administered 2020-01-14: 40 mg via SUBCUTANEOUS
  Filled 2020-01-14: qty 0.4

## 2020-01-14 MED ORDER — WARFARIN SODIUM 7.5 MG PO TABS: 7.5000 mg | ORAL_TABLET | Freq: Once | ORAL | Status: DC

## 2020-01-14 MED ORDER — METHOCARBAMOL 500 MG PO TABS: 500.0000 mg | ORAL_TABLET | Freq: Three times a day (TID) | ORAL | 0 refills | Status: DC | PRN

## 2020-01-14 MED ORDER — OXYCODONE HCL 5 MG PO TABS: 5.0000 mg | ORAL_TABLET | ORAL | 0 refills | Status: DC | PRN

## 2020-01-14 NOTE — Progress Notes (Signed)
Physical Therapy Treatment Patient Details Name: Travis Chase MRN: [id number] DOB: 11-10-1946 Today's Date: 01/14/2020    History of Present Illness 74 yo male s/p L UKR on 3/4. PMH includes afib with history of cardioversion, cardiomyopathy, HF, alcohol use, obesity, HTN, HLD, OA.    PT Comments    PT called back to pt room as DME supplier reports pt was adamantly refusing RW. When PT saw pt this morning, he was very open to and appreciative of RW, and ambulated safely with it. PT educated pt on importance of RW for providing stability during gait post-operatively, and PT's recommendation remains RW for d/c equipment. However, pt refused with each PT explanation this afternoon, and states "I'll take one in another 15 years, I will just use furniture and walls to walk at home".  PT strongly discouraged this, asks if pt will take a cane and he says "no, but I will use my walking sticks that I use for hiking". So, PT saw pt this afternoon to trial use of SPC during gait. Pt definitely not as steady using cane and requires cuing for safety, but pt remained insistent on not using RW. Pt encouraged to use walking sticks for ambulation upon d/c as he refused RW and cane, and to have wife with pt when he is up and walking over the next few days. Pt is agreeable to this plan, and knowingly accepts the fact that 1 or 2 walking sticks is not as safe as use of RW. Pt to d/c today.    Follow Up Recommendations  Follow surgeon's recommendation for DC plan and follow-up therapies;Supervision for mobility/OOB     Equipment Recommendations  None recommended by PT    Recommendations for Other Services       Precautions / Restrictions Precautions Precautions: Fall Restrictions Weight Bearing Restrictions: No Other Position/Activity Restrictions: WBAT    Mobility  Bed Mobility Overal bed mobility: Needs Assistance             General bed mobility comments: up in chair upon PT arrival, requests  stay in chair upon PT exit.  Transfers Overall transfer level: Needs assistance Equipment used: Straight cane Transfers: Sit to/from Stand Sit to Stand: Min assist         General transfer comment: Min assist to steady, pt with use of cane in L hand and pt educated to place cane in R to be supportive to LLE.  Ambulation/Gait Ambulation/Gait assistance: Min guard Gait Distance (Feet): 100 Feet Assistive device: Straight cane Gait Pattern/deviations: Step-through pattern;Decreased stride length;Trunk flexed;Antalgic Gait velocity: decr   General Gait Details: min guard for safety, pt with antalgic gait which improved with further ambulation distance. Pt encouraged to slow down, as pt moving quickly. Verbal cuing for sequencing and placement of straight cane.   Stairs             Wheelchair Mobility    Modified Rankin (Stroke Patients Only)       Balance Overall balance assessment: Mild deficits observed, not formally tested;History of Falls(x1 fall while in wet grass)                                          Cognition Arousal/Alertness: Awake/alert Behavior During Therapy: Impulsive;Restless Overall Cognitive Status: Within Functional Limits for tasks assessed  General Comments: Pt eager to d/c, moves impulsively and unsafely this afternoon      Exercises      General Comments        Pertinent Vitals/Pain Pain Assessment: 0-10 Pain Score: 4  Pain Location: L knee Pain Descriptors / Indicators: Sore Pain Intervention(s): Limited activity within patient's tolerance;Monitored during session;Repositioned;RN gave pain meds during session    Home Living                      Prior Function            PT Goals (current goals can now be found in the care plan section) Acute Rehab PT Goals Patient Stated Goal: go home today PT Goal Formulation: With patient Time For Goal Achievement:  01/21/20 Potential to Achieve Goals: Good Progress towards PT goals: Progressing toward goals    Frequency    7X/week      PT Plan Equipment recommendations need to be updated    Co-evaluation              AM-PAC PT "6 Clicks" Mobility   Outcome Measure  Help needed turning from your back to your side while in a flat bed without using bedrails?: A Little Help needed moving from lying on your back to sitting on the side of a flat bed without using bedrails?: A Little Help needed moving to and from a bed to a chair (including a wheelchair)?: A Little Help needed standing up from a chair using your arms (e.g., wheelchair or bedside chair)?: A Little Help needed to walk in hospital room?: A Little Help needed climbing 3-5 steps with a railing? : A Little 6 Click Score: 18    End of Session   Activity Tolerance: Patient tolerated treatment well Patient left: in chair;with call bell/phone within reach;with nursing/sitter in room Nurse Communication: Mobility status PT Visit Diagnosis: Other abnormalities of gait and mobility (R26.89);Muscle weakness (generalized) (M62.81)     Time: [id number] PT Time Calculation (min) (ACUTE ONLY): 15 min  Charges:  $Gait Training: 8-22 mins                     Travis Chase E, PT [place] Pager [phone]  Office [phone]   Travis Chase D Travis Chase 01/14/2020, 2:42 PM

## 2020-01-14 NOTE — Progress Notes (Signed)
[facility] [place] for Coumadin Indication: atrial fibrillation  No Known Allergies  Patient Measurements: Height: 5\' 10"  (177.8 cm) Weight: 225 lb (102.1 kg) IBW/kg (Calculated) : 73  Vital Signs: Temp: 98.3 F (36.8 C) (03/05 0731) Temp Source: Oral (03/05 0731) BP: 110/80 (03/05 0731) Pulse Rate: 61 (03/05 0731)  Labs: Recent Labs    01/13/20 1036 01/14/20 0436  LABPROT 15.2 14.7  INR 1.2 1.2    Estimated Creatinine Clearance: 75.6 mL/min (by C-G formula based on SCr of 1.01 mg/dL).   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 Marlou Sa until INR>2  Thank you Anette Guarneri, PharmD  01/14/2020,9:43 AM

## 2020-01-14 NOTE — Progress Notes (Signed)
Orthopedic Tech Progress Note Patient Details:  Travis Chase 10-28-1946 [id number] RN called requesting a BONE FOAM.. I let her know that some had been ordered on Monday and is waiting for them to get here. Patient ID: Travis Chase, male   DOB: 05/07/1946, 74 y.o.   MRN: [id number]   [name] 01/14/2020, 10:41 AM

## 2020-01-14 NOTE — Plan of Care (Signed)
Pt discharging to home. Discharge instructions explained to pt and pt verbalized understanding. Packed all personal belongings. No further questions or concerns voiced. Awaiting transportation.   Problem: Education: Goal: Knowledge of General Education information will improve Description: Including pain rating scale, medication(s)/side effects and non-pharmacologic comfort measures 01/14/2020 1116 by Stevan Born, RN Outcome: Completed/Met 01/14/2020 1114 by Stevan Born, RN Outcome: Progressing   Problem: Health Behavior/Discharge Planning: Goal: Ability to manage health-related needs will improve 01/14/2020 1116 by Stevan Born, RN Outcome: Completed/Met 01/14/2020 1114 by Stevan Born, RN Outcome: Progressing   Problem: Clinical Measurements: Goal: Ability to maintain clinical measurements within normal limits will improve Outcome: Completed/Met Goal: Will remain free from infection 01/14/2020 1116 by Stevan Born, RN Outcome: Completed/Met 01/14/2020 1114 by Stevan Born, RN Outcome: Progressing Goal: Diagnostic test results will improve Outcome: Completed/Met Goal: Respiratory complications will improve Outcome: Completed/Met Goal: Cardiovascular complication will be avoided Outcome: Completed/Met   Problem: Activity: Goal: Risk for activity intolerance will decrease Outcome: Completed/Met   Problem: Nutrition: Goal: Adequate nutrition will be maintained Outcome: Completed/Met   Problem: Coping: Goal: Level of anxiety will decrease 01/14/2020 1116 by Stevan Born, RN Outcome: Completed/Met 01/14/2020 1114 by Stevan Born, RN Outcome: Progressing   Problem: Elimination: Goal: Will not experience complications related to bowel motility Outcome: Completed/Met Goal: Will not experience complications related to urinary retention Outcome: Completed/Met   Problem: Pain Managment: Goal: General experience of comfort will improve 01/14/2020 1116 by  Stevan Born, RN Outcome: Completed/Met 01/14/2020 1114 by Stevan Born, RN Outcome: Progressing   Problem: Safety: Goal: Ability to remain free from injury will improve 01/14/2020 1116 by Stevan Born, RN Outcome: Completed/Met 01/14/2020 1114 by Stevan Born, RN Outcome: Progressing   Problem: Skin Integrity: Goal: Risk for impaired skin integrity will decrease 01/14/2020 1116 by Stevan Born, RN Outcome: Completed/Met 01/14/2020 1114 by Stevan Born, RN Outcome: Progressing

## 2020-01-14 NOTE — Plan of Care (Signed)
  Problem: Education: Goal: Knowledge of General Education information will improve Description: Including pain rating scale, medication(s)/side effects and non-pharmacologic comfort measures Outcome: Progressing   Problem: Health Behavior/Discharge Planning: Goal: Ability to manage health-related needs will improve Outcome: Progressing   Problem: Clinical Measurements: Goal: Will remain free from infection Outcome: Progressing   Problem: Coping: Goal: Level of anxiety will decrease Outcome: Progressing   Problem: Pain Managment: Goal: General experience of comfort will improve Outcome: Progressing   Problem: Safety: Goal: Ability to remain free from injury will improve Outcome: Progressing   Problem: Skin Integrity: Goal: Risk for impaired skin integrity will decrease Outcome: Progressing   

## 2020-01-14 NOTE — Progress Notes (Signed)
Educated Therapist, sports that it is not best practice to place IVs just in case (need to preserve veins for when IV needed). If patient does need IV access for medication, test, etc. Please re-consult IV team.

## 2020-01-14 NOTE — Evaluation (Signed)
Physical Therapy Evaluation Patient Details Name: Travis Chase MRN: [id number] DOB: [birth year] Today's Date: 01/14/2020   History of Present Illness  74 yo male s/p L UKR on 3/4. PMH includes afib with history of cardioversion, cardiomyopathy, HF, alcohol use, obesity, HTN, HLD, OA.  Clinical Impression   Pt presents with L knee pain, decreased L knee ROM post-operatively, increased time and effort to mobilize, and decreased activity tolerance post-operatively. Pt ambulated great hallway distance with use of RW and min verbal cuing for form and safety, navigated steps with min guard from PT and cuing to demonstrate readiness to enter home, and practiced UKR exercises with multimodal cuing from PT and use of exercise handout. Pt with no further questions, demonstrates readiness to d/c from from PT standpoint. RN notified of pt readiness, pending pt receiving RW.      Follow Up Recommendations Follow surgeon's recommendation for DC plan and follow-up therapies;Supervision for mobility/OOB    Equipment Recommendations  Rolling walker with 5" wheels    Recommendations for Other Services       Precautions / Restrictions Precautions Precautions: Fall Restrictions Weight Bearing Restrictions: No Other Position/Activity Restrictions: WBAT      Mobility  Bed Mobility Overal bed mobility: Needs Assistance Bed Mobility: Supine to Sit     Supine to sit: HOB elevated;Min guard     General bed mobility comments: Supervision for safety, increased time and effort to move to EOB with HOB elevation utilized. Pt's wife able to assist pt in and out of bed.  Transfers Overall transfer level: Needs assistance Equipment used: Rolling walker (2 wheeled) Transfers: Sit to/from Stand Sit to Stand: Min guard;From elevated surface         General transfer comment: Min guard for safety, verbal cuing for hand placement when rising and sitting.  Ambulation/Gait Ambulation/Gait assistance: Min  guard;Supervision Gait Distance (Feet): 200 Feet Assistive device: Rolling walker (2 wheeled) Gait Pattern/deviations: Step-through pattern;Decreased stride length;Trunk flexed;Antalgic Gait velocity: slightly decr   General Gait Details: Min guard for safety, transitioning to supervision when pt demonstrated L knee stability and overall steadiness. Verbal cuing for sequencing, placement in RW, step-through gait with upright posture. Mild antalgic gait.  Stairs Stairs: Yes Stairs assistance: Min guard Stair Management: One rail Right;Forwards;Step to pattern Number of Stairs: 3 General stair comments: 2 x 3 steps, verbal cuing for sequencing  (up with RLE leading, down with LLE leading, pt able to teach back to PT afterwards), step-to pattern. Pt with use of R handrail, states he is able to use wall as railing.  Wheelchair Mobility    Modified Rankin (Stroke Patients Only)       Balance Overall balance assessment: Mild deficits observed, not formally tested;History of Falls(x1 fall while in wet grass)                                           Pertinent Vitals/Pain Pain Assessment: 0-10 Pain Score: 4  Pain Location: L knee Pain Descriptors / Indicators: Sore Pain Intervention(s): Limited activity within patient's tolerance;Monitored during session;Premedicated before session;Repositioned    Home Living Family/patient expects to be discharged to:: Private residence Living Arrangements: Spouse/significant other Available Help at Discharge: Family;Available 24 hours/day Type of Home: House Home Access: Stairs to enter Entrance Stairs-Rails: None(uses wall as support) Entrance Stairs-Number of Steps: 3 Home Layout: One level;Other (Comment)(a couple of steps to get to den)  Home Equipment: None      Prior Function Level of Independence: Independent         Comments: Pt reports independence PTA, states he ambulates with walking sticks when hiking      Hand Dominance   Dominant Hand: Right    Extremity/Trunk Assessment   Upper Extremity Assessment Upper Extremity Assessment: Overall WFL for tasks assessed    Lower Extremity Assessment Lower Extremity Assessment: Overall WFL for tasks assessed;LLE deficits/detail LLE Deficits / Details: anticipated post-surgical weakness; able to perform ankle pumps, quad set, SLR without significant quad lag, heel slide to 75* LLE Sensation: WNL    Cervical / Trunk Assessment Cervical / Trunk Assessment: Normal  Communication   Communication: No difficulties  Cognition Arousal/Alertness: Awake/alert Behavior During Therapy: WFL for tasks assessed/performed Overall Cognitive Status: Within Functional Limits for tasks assessed                                        General Comments      Exercises Total Joint Exercises Ankle Circles/Pumps: AROM;Both;20 reps;Seated Quad Sets: AROM;Left;10 reps;Seated Short Arc Quad: AROM;Left;10 reps;Seated Heel Slides: AROM;AAROM;Left;10 reps;Seated(heel slide in upright sitting with towel, as well as heel slide in recline position) Hip ABduction/ADduction: AAROM;Left;10 reps;Seated Straight Leg Raises: AAROM;Left;10 reps;Supine Goniometric ROM: knee aarom ~5-75*, limited by pain and fatigue   Assessment/Plan    PT Assessment All further PT needs can be met in the next venue of care  PT Problem List Decreased strength;Decreased mobility;Decreased activity tolerance;Decreased range of motion;Decreased balance;Decreased knowledge of use of DME;Pain       PT Treatment Interventions DME instruction;Therapeutic activities;Gait training;Therapeutic exercise;Patient/family education;Balance training;Stair training;Functional mobility training;Neuromuscular re-education    PT Goals (Current goals can be found in the Care Plan section)  Acute Rehab PT Goals Patient Stated Goal: go home today PT Goal Formulation: With patient Time For  Goal Achievement: 01/21/20 Potential to Achieve Goals: Good    Frequency 7X/week   Barriers to discharge        Co-evaluation               AM-PAC PT "6 Clicks" Mobility  Outcome Measure Help needed turning from your back to your side while in a flat bed without using bedrails?: A Little Help needed moving from lying on your back to sitting on the side of a flat bed without using bedrails?: A Little Help needed moving to and from a bed to a chair (including a wheelchair)?: None Help needed standing up from a chair using your arms (e.g., wheelchair or bedside chair)?: None Help needed to walk in hospital room?: A Little Help needed climbing 3-5 steps with a railing? : A Little 6 Click Score: 20    End of Session Equipment Utilized During Treatment: Gait belt Activity Tolerance: Patient tolerated treatment well Patient left: in chair;with call bell/phone within reach;with chair alarm set Nurse Communication: Mobility status PT Visit Diagnosis: Other abnormalities of gait and mobility (R26.89);Muscle weakness (generalized) (M62.81)    Time: 8768-1157 PT Time Calculation (min) (ACUTE ONLY): 35 min   Charges:   PT Evaluation $PT Eval Low Complexity: 1 Low PT Treatments $Gait Training: 8-22 mins        Song Garris E, PT Acute Rehabilitation Services Pager [phone]  Office [phone]   Marcoantonio Legault D Jessicah Croll 01/14/2020, 10:09 AM

## 2020-01-15 DIAGNOSIS — I4819 Other persistent atrial fibrillation: Secondary | ICD-10-CM | POA: Diagnosis not present

## 2020-01-15 DIAGNOSIS — N400 Enlarged prostate without lower urinary tract symptoms: Secondary | ICD-10-CM | POA: Diagnosis not present

## 2020-01-15 DIAGNOSIS — K5730 Diverticulosis of large intestine without perforation or abscess without bleeding: Secondary | ICD-10-CM | POA: Diagnosis not present

## 2020-01-15 DIAGNOSIS — I4892 Unspecified atrial flutter: Secondary | ICD-10-CM | POA: Diagnosis not present

## 2020-01-15 DIAGNOSIS — G4733 Obstructive sleep apnea (adult) (pediatric): Secondary | ICD-10-CM | POA: Diagnosis not present

## 2020-01-15 DIAGNOSIS — E785 Hyperlipidemia, unspecified: Secondary | ICD-10-CM | POA: Diagnosis not present

## 2020-01-15 DIAGNOSIS — Z8546 Personal history of malignant neoplasm of prostate: Secondary | ICD-10-CM | POA: Diagnosis not present

## 2020-01-15 DIAGNOSIS — E669 Obesity, unspecified: Secondary | ICD-10-CM | POA: Diagnosis not present

## 2020-01-15 DIAGNOSIS — I420 Dilated cardiomyopathy: Secondary | ICD-10-CM | POA: Diagnosis not present

## 2020-01-15 DIAGNOSIS — Z96652 Presence of left artificial knee joint: Secondary | ICD-10-CM | POA: Diagnosis not present

## 2020-01-15 DIAGNOSIS — I5042 Chronic combined systolic (congestive) and diastolic (congestive) heart failure: Secondary | ICD-10-CM | POA: Diagnosis not present

## 2020-01-15 DIAGNOSIS — Z7901 Long term (current) use of anticoagulants: Secondary | ICD-10-CM | POA: Diagnosis not present

## 2020-01-15 DIAGNOSIS — Z6832 Body mass index (BMI) 32.0-32.9, adult: Secondary | ICD-10-CM | POA: Diagnosis not present

## 2020-01-15 DIAGNOSIS — Z471 Aftercare following joint replacement surgery: Secondary | ICD-10-CM | POA: Diagnosis not present

## 2020-01-15 DIAGNOSIS — I110 Hypertensive heart disease with heart failure: Secondary | ICD-10-CM | POA: Diagnosis not present

## 2020-01-15 DIAGNOSIS — M19011 Primary osteoarthritis, right shoulder: Secondary | ICD-10-CM | POA: Diagnosis not present

## 2020-01-17 ENCOUNTER — Telehealth: Payer: Self-pay | Admitting: Orthopedic Surgery

## 2020-01-17 DIAGNOSIS — Z96659 Presence of unspecified artificial knee joint: Secondary | ICD-10-CM

## 2020-01-17 NOTE — Telephone Encounter (Signed)
IC verbal given. Also faxed referral for walker

## 2020-01-17 NOTE — Telephone Encounter (Signed)
Verdis Frederickson from [facility] called. She would like verbal orders for PT 1X WK 1WK, 3x wk 1 wk, 2xwk 1 wk. Also she would like a rolling walker RX faxed to [phone]

## 2020-01-21 ENCOUNTER — Ambulatory Visit: Payer: Self-pay

## 2020-01-21 ENCOUNTER — Ambulatory Visit (INDEPENDENT_AMBULATORY_CARE_PROVIDER_SITE_OTHER): Payer: Medicare Other | Admitting: Orthopedic Surgery

## 2020-01-21 ENCOUNTER — Encounter: Payer: Self-pay | Admitting: Orthopedic Surgery

## 2020-01-21 ENCOUNTER — Other Ambulatory Visit: Payer: Self-pay

## 2020-01-21 DIAGNOSIS — Z96652 Presence of left artificial knee joint: Secondary | ICD-10-CM

## 2020-01-21 MED ORDER — OXYCODONE HCL 5 MG PO TABS: 5.0000 mg | ORAL_TABLET | ORAL | 0 refills | Status: AC | PRN
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
## 2020-01-22 NOTE — Discharge Summary (Signed)
Physician Discharge Summary      Patient ID: Travis Chase MRN: [id number] DOB/AGE: 76-Jun-1947 76 y.o.  Admit date: 01/13/2020 Discharge date: 01/14/2020  Admission Diagnoses:  Active Problems:   Arthritis of left knee   Discharge Diagnoses:  Same  Surgeries: Procedure(s): LEFT UNICOMPARTMENTAL KNEE REPLACEMENT on 01/13/2020   Consultants:   Discharged Condition: Stable  Hospital Course: Travis Chase is an 74 y.o. male who was admitted 01/13/2020 with a chief complaint of left knee pain, and found to have a diagnosis of left knee medial compartment OA.  They were brought to the operating room on 01/13/2020 and underwent the above named procedures.  Pt awoke from anesthesia without complication and was transferred to the floor. On POD1, patient was doing well and mobilized well. He felt comfortable with discharge on POD1 and was discharged home.  Pt will f/u with Dr. Marlou Sa in clinic in ~2 weeks.   Antibiotics given:  Anti-infectives (From admission, onward)   Start     Dose/Rate Route Frequency Ordered Stop   01/13/20 1815  ceFAZolin (ANCEF) IVPB 2g/100 mL premix     2 g 200 mL/hr over 30 Minutes Intravenous Every 6 hours 01/13/20 1746 01/13/20 2356   01/13/20 1355  vancomycin (VANCOCIN) powder  Status:  Discontinued       As needed 01/13/20 1355 01/13/20 1533   01/13/20 1003  ceFAZolin (ANCEF) 2-4 GM/100ML-% IVPB    Note to Pharmacy: Odelia Gage   : cabinet override      01/13/20 1003 01/13/20 1211   01/13/20 1000  ceFAZolin (ANCEF) IVPB 2g/100 mL premix     2 g 200 mL/hr over 30 Minutes Intravenous On call to O.R. 01/13/20 [id number] 01/13/20 1235    .  Recent vital signs:  Vitals:   01/14/20 0731 01/14/20 1301  BP: 110/80 106/66  Pulse: 61 60  Resp: 17 17  Temp: 98.3 F (36.8 C) 98.8 F (37.1 C)  SpO2: 95% 96%    Recent laboratory studies:  Results for orders placed or performed during the hospital encounter of 01/13/20  PT- INR Day of Surgery  Result Value Ref  Range   Prothrombin Time 15.2 11.4 - 15.2 seconds   INR 1.2 0.8 - 1.2  Protime-INR  Result Value Ref Range   Prothrombin Time 14.7 11.4 - 15.2 seconds   INR 1.2 0.8 - 1.2    Discharge Medications:   Allergies as of 01/14/2020   No Known Allergies     Medication List    TAKE these medications   amiodarone 200 MG tablet Commonly known as: PACERONE Take one tablet by mouth Monday through Saturday.  Do NOT take on Sunday. What changed:   how much to take  how to take this  when to take this  additional instructions   enoxaparin 30 MG/0.3ML injection Commonly known as: Lovenox Inject 0.3 mLs (30 mg total) into the skin daily.   Fish Oil 1000 MG Caps Take 1,000 mg by mouth at bedtime.   GLUCOSAMINE CHONDR 1500 COMPLX PO Take 2 tablets by mouth at bedtime.   methocarbamol 500 MG tablet Commonly known as: Robaxin Take 1 tablet (500 mg total) by mouth every 8 (eight) hours as needed for muscle spasms.   metoprolol tartrate 25 MG tablet Commonly known as: LOPRESSOR Take 25 mg by mouth daily.   tiZANidine 2 MG tablet Commonly known as: ZANAFLEX Take 4 mg by mouth 3 (three) times daily.   warfarin 5 MG tablet Commonly known as:  COUMADIN Take as directed. If you are unsure how to take this medication, talk to your nurse or doctor. Original instructions: TAKE 1 TABLET BY MOUTH EVERY DAY What changed:   how much to take  when to take this  additional instructions       Diagnostic Studies: DG Knee Left Port  Result Date: 01/13/2020 CLINICAL DATA:  Postop. EXAM: PORTABLE LEFT KNEE - 1-2 VIEW COMPARISON:  Preoperative radiograph 12/06/2019 FINDINGS: Medial compartment hemiarthroplasty in expected alignment. No periprosthetic lucency or fracture. Patellofemoral osteoarthritis. Recent postsurgical change includes air and edema in the joint space and soft tissues. IMPRESSION: Post medial compartment hemiarthroplasty. No immediate postoperative complication. Electronically  Signed   By: Keith Rake M.D.   On: 01/13/2020 18:21    Disposition: Discharge disposition: 01-Home or Self Care       Discharge Instructions    Call MD / Call 911   Complete by: As directed    If you experience chest pain or shortness of breath, CALL 911 and be transported to the hospital emergency room.  If you develope a fever above 101 F, pus (white drainage) or increased drainage or redness at the wound, or calf pain, call your surgeon's office.   Constipation Prevention   Complete by: As directed    Drink plenty of fluids.  Prune juice may be helpful.  You may use a stool softener, such as Colace (over the counter) 100 mg twice a day.  Use MiraLax (over the counter) for constipation as needed.   Diet - low sodium heart healthy   Complete by: As directed    Discharge instructions   Complete by: As directed    You may shower, dressing is waterproof.  Do not remove the dressing, we will remove it at your first post-op appointment.  Do not take a bath or soak the knee in a tub or pool.  You may weightbear as you can tolerate on the operative leg with a walker.  Use the blue cradle boot or a pillow under your heel to work on getting your leg straight.  Do NOT put a pillow or anything under your knee.  You will follow-up with Dr. Marlou Sa in the clinic in 1-2 weeks at your given appointment date.  To prevent blood clots, resume your regular coumadin.  Additionally, Lovenox injections were prescribed and use these for 4 days until the coumadin returns to its normal levels in your bloodstream.   Increase activity slowly as tolerated   Complete by: As directed          Signed: Donella Stade 01/22/2020, 9:02 PM

## 2020-01-26 ENCOUNTER — Telehealth: Payer: Self-pay | Admitting: Orthopedic Surgery

## 2020-01-26 NOTE — Telephone Encounter (Signed)
01/21/20 & 3/4 op note faxed to [facility] for appt 3/16. Fax [phone]

## 2020-01-27 DIAGNOSIS — M1712 Unilateral primary osteoarthritis, left knee: Secondary | ICD-10-CM | POA: Diagnosis not present

## 2020-01-27 DIAGNOSIS — M25562 Pain in left knee: Secondary | ICD-10-CM | POA: Diagnosis not present

## 2020-02-01 ENCOUNTER — Ambulatory Visit: Payer: Medicare Other | Admitting: Pharmacist

## 2020-02-01 ENCOUNTER — Other Ambulatory Visit: Payer: Self-pay

## 2020-02-01 DIAGNOSIS — I483 Typical atrial flutter: Secondary | ICD-10-CM | POA: Diagnosis not present

## 2020-02-01 DIAGNOSIS — M25562 Pain in left knee: Secondary | ICD-10-CM | POA: Diagnosis not present

## 2020-02-01 DIAGNOSIS — Z5181 Encounter for therapeutic drug level monitoring: Secondary | ICD-10-CM

## 2020-02-01 DIAGNOSIS — M1712 Unilateral primary osteoarthritis, left knee: Secondary | ICD-10-CM | POA: Diagnosis not present

## 2020-02-01 LAB — POCT INR: INR: 2.1 (ref 2.0–3.0)

## 2020-02-01 NOTE — Patient Instructions (Signed)
Continue on same dosage 1 tablet daily except 1/2 tablet on Mondays and Fridays. Recheck INR in 4 weeks. Please call the Coumadin Clinic with any Coumadin related issues and with new medications/changes and procedures/surgeries: [facility] [place] [phone]

## 2020-02-03 DIAGNOSIS — M25562 Pain in left knee: Secondary | ICD-10-CM | POA: Diagnosis not present

## 2020-02-03 DIAGNOSIS — M1712 Unilateral primary osteoarthritis, left knee: Secondary | ICD-10-CM | POA: Diagnosis not present

## 2020-02-08 DIAGNOSIS — M1712 Unilateral primary osteoarthritis, left knee: Secondary | ICD-10-CM | POA: Diagnosis not present

## 2020-02-08 DIAGNOSIS — M25562 Pain in left knee: Secondary | ICD-10-CM | POA: Diagnosis not present

## 2020-02-10 DIAGNOSIS — M25562 Pain in left knee: Secondary | ICD-10-CM | POA: Diagnosis not present

## 2020-02-10 DIAGNOSIS — D649 Anemia, unspecified: Secondary | ICD-10-CM | POA: Diagnosis not present

## 2020-02-10 DIAGNOSIS — M1712 Unilateral primary osteoarthritis, left knee: Secondary | ICD-10-CM | POA: Diagnosis not present

## 2020-02-10 DIAGNOSIS — K589 Irritable bowel syndrome without diarrhea: Secondary | ICD-10-CM | POA: Diagnosis not present

## 2020-02-15 DIAGNOSIS — M1712 Unilateral primary osteoarthritis, left knee: Secondary | ICD-10-CM | POA: Diagnosis not present

## 2020-02-15 DIAGNOSIS — M25562 Pain in left knee: Secondary | ICD-10-CM | POA: Diagnosis not present

## 2020-02-23 DIAGNOSIS — M25562 Pain in left knee: Secondary | ICD-10-CM | POA: Diagnosis not present

## 2020-02-23 DIAGNOSIS — M1712 Unilateral primary osteoarthritis, left knee: Secondary | ICD-10-CM | POA: Diagnosis not present

## 2020-02-25 DIAGNOSIS — M25562 Pain in left knee: Secondary | ICD-10-CM | POA: Diagnosis not present

## 2020-02-25 DIAGNOSIS — M1712 Unilateral primary osteoarthritis, left knee: Secondary | ICD-10-CM | POA: Diagnosis not present

## 2020-02-29 DIAGNOSIS — M1712 Unilateral primary osteoarthritis, left knee: Secondary | ICD-10-CM | POA: Diagnosis not present

## 2020-02-29 DIAGNOSIS — M25562 Pain in left knee: Secondary | ICD-10-CM | POA: Diagnosis not present

## 2020-03-02 DIAGNOSIS — M1712 Unilateral primary osteoarthritis, left knee: Secondary | ICD-10-CM | POA: Diagnosis not present

## 2020-03-02 DIAGNOSIS — M25562 Pain in left knee: Secondary | ICD-10-CM | POA: Diagnosis not present

## 2020-03-03 ENCOUNTER — Encounter: Payer: Self-pay | Admitting: Orthopedic Surgery

## 2020-03-03 ENCOUNTER — Ambulatory Visit (HOSPITAL_COMMUNITY): Admission: RE | Admit: 2020-03-03 | Payer: Medicare Other | Source: Ambulatory Visit

## 2020-03-03 ENCOUNTER — Other Ambulatory Visit: Payer: Self-pay

## 2020-03-03 ENCOUNTER — Ambulatory Visit: Payer: Medicare Other | Admitting: Orthopedic Surgery

## 2020-03-03 ENCOUNTER — Telehealth: Payer: Self-pay | Admitting: *Deleted

## 2020-03-03 VITALS — Ht 70.0 in | Wt 225.0 lb

## 2020-03-03 DIAGNOSIS — M7989 Other specified soft tissue disorders: Secondary | ICD-10-CM

## 2020-03-03 DIAGNOSIS — M79662 Pain in left lower leg: Secondary | ICD-10-CM

## 2020-03-03 DIAGNOSIS — Z96652 Presence of left artificial knee joint: Secondary | ICD-10-CM

## 2020-03-03 NOTE — Telephone Encounter (Signed)
Ultrasound appt scheduled for this afternoon at 130pm at [facility] Entrance C parking lot to heart and vascular from [facility] [address]. I called pt left vm to return my call.

## 2020-03-03 NOTE — Telephone Encounter (Signed)
Pt called back stating he could not his appt for today for the Ultrasound and wanted to be rescheduled for Monday instead, appt scheduled for mon at 1:00pm at [facility] heart and vascular, pt is aware of appt.
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
## 2020-03-04 ENCOUNTER — Encounter: Payer: Self-pay | Admitting: Orthopedic Surgery

## 2020-03-04 NOTE — Telephone Encounter (Signed)
Thx sabrina

## 2020-03-06 ENCOUNTER — Ambulatory Visit (HOSPITAL_COMMUNITY)
Admission: RE | Admit: 2020-03-06 | Discharge: 2020-03-06 | Disposition: A | Discharge status: Home / Self Care | Payer: Medicare Other | Source: Ambulatory Visit | Attending: Orthopedic Surgery | Admitting: Orthopedic Surgery

## 2020-03-06 ENCOUNTER — Other Ambulatory Visit: Payer: Self-pay

## 2020-03-06 DIAGNOSIS — M79662 Pain in left lower leg: Secondary | ICD-10-CM | POA: Diagnosis not present

## 2020-03-06 DIAGNOSIS — M7989 Other specified soft tissue disorders: Secondary | ICD-10-CM

## 2020-03-06 DIAGNOSIS — Z96652 Presence of left artificial knee joint: Secondary | ICD-10-CM

## 2020-03-06 NOTE — Progress Notes (Signed)
Lower extremity venous has been completed.   Preliminary results in CV Proc.   Travis Chase 03/06/2020 1:08 PM

## 2020-03-07 ENCOUNTER — Ambulatory Visit (INDEPENDENT_AMBULATORY_CARE_PROVIDER_SITE_OTHER): Payer: Medicare Other | Admitting: Pharmacist

## 2020-03-07 DIAGNOSIS — I483 Typical atrial flutter: Secondary | ICD-10-CM

## 2020-03-07 DIAGNOSIS — Z5181 Encounter for therapeutic drug level monitoring: Secondary | ICD-10-CM

## 2020-03-07 DIAGNOSIS — M1712 Unilateral primary osteoarthritis, left knee: Secondary | ICD-10-CM | POA: Diagnosis not present

## 2020-03-07 DIAGNOSIS — M25562 Pain in left knee: Secondary | ICD-10-CM | POA: Diagnosis not present

## 2020-03-07 LAB — POCT INR: INR: 3.9 — AB (ref 2.0–3.0)

## 2020-03-07 NOTE — Patient Instructions (Signed)
Hold coumadin today then resume the dose you were supposed to take of 1 tablet daily except 1/2 tablet on Mondays and Fridays. Recheck INR in 3 weeks. Please call the Coumadin Clinic with any Coumadin related issues and with new medications/changes and procedures/surgeries: [facility] [place] [phone]

## 2020-03-10 DIAGNOSIS — M25562 Pain in left knee: Secondary | ICD-10-CM | POA: Diagnosis not present

## 2020-03-10 DIAGNOSIS — M1712 Unilateral primary osteoarthritis, left knee: Secondary | ICD-10-CM | POA: Diagnosis not present

## 2020-03-15 DIAGNOSIS — M1712 Unilateral primary osteoarthritis, left knee: Secondary | ICD-10-CM | POA: Diagnosis not present

## 2020-03-15 DIAGNOSIS — M25562 Pain in left knee: Secondary | ICD-10-CM | POA: Diagnosis not present

## 2020-03-23 ENCOUNTER — Other Ambulatory Visit: Payer: Self-pay | Admitting: Internal Medicine

## 2020-03-27 DIAGNOSIS — H6091 Unspecified otitis externa, right ear: Secondary | ICD-10-CM | POA: Diagnosis not present

## 2020-03-27 DIAGNOSIS — M542 Cervicalgia: Secondary | ICD-10-CM | POA: Diagnosis not present

## 2020-03-27 DIAGNOSIS — H6121 Impacted cerumen, right ear: Secondary | ICD-10-CM | POA: Diagnosis not present

## 2020-03-28 ENCOUNTER — Other Ambulatory Visit: Payer: Self-pay

## 2020-03-28 ENCOUNTER — Ambulatory Visit (INDEPENDENT_AMBULATORY_CARE_PROVIDER_SITE_OTHER): Payer: Medicare Other | Admitting: Pharmacist

## 2020-03-28 DIAGNOSIS — I483 Typical atrial flutter: Secondary | ICD-10-CM | POA: Diagnosis not present

## 2020-03-28 DIAGNOSIS — Z5181 Encounter for therapeutic drug level monitoring: Secondary | ICD-10-CM | POA: Diagnosis not present

## 2020-03-28 LAB — POCT INR: INR: 2.6 (ref 2.0–3.0)

## 2020-04-25 ENCOUNTER — Other Ambulatory Visit: Payer: Self-pay

## 2020-04-25 ENCOUNTER — Ambulatory Visit (INDEPENDENT_AMBULATORY_CARE_PROVIDER_SITE_OTHER): Payer: Medicare Other | Admitting: Pharmacist

## 2020-04-25 DIAGNOSIS — I483 Typical atrial flutter: Secondary | ICD-10-CM | POA: Diagnosis not present

## 2020-04-25 DIAGNOSIS — I4891 Unspecified atrial fibrillation: Secondary | ICD-10-CM | POA: Diagnosis not present

## 2020-04-25 DIAGNOSIS — Z5181 Encounter for therapeutic drug level monitoring: Secondary | ICD-10-CM | POA: Diagnosis not present

## 2020-04-25 LAB — POCT INR: INR: 3.5 — AB (ref 2.0–3.0)

## 2020-04-25 NOTE — Patient Instructions (Addendum)
Description   Hold dose tonight.  Take 1 tablet daily except 1/2 tablet on Mondays and Fridays. Recheck INR in 3 weeks. Amio decreased to 6 days a week 12/23/19. Please call the Coumadin Clinic with any Coumadin related issues and with new medications/changes and procedures/surgeries: [facility] [place] [phone]

## 2020-05-01 DIAGNOSIS — Z0120 Encounter for dental examination and cleaning without abnormal findings: Secondary | ICD-10-CM | POA: Diagnosis not present

## 2020-05-16 ENCOUNTER — Ambulatory Visit: Payer: Medicare Other | Admitting: Pharmacist Clinician (PhC)/ Clinical Pharmacy Specialist

## 2020-05-16 ENCOUNTER — Other Ambulatory Visit: Payer: Self-pay

## 2020-05-16 DIAGNOSIS — I483 Typical atrial flutter: Secondary | ICD-10-CM | POA: Diagnosis not present

## 2020-05-16 DIAGNOSIS — Z5181 Encounter for therapeutic drug level monitoring: Secondary | ICD-10-CM | POA: Diagnosis not present

## 2020-05-16 LAB — POCT INR: INR: 2.2 (ref 2.0–3.0)

## 2020-06-15 DIAGNOSIS — I10 Essential (primary) hypertension: Secondary | ICD-10-CM | POA: Diagnosis not present

## 2020-06-15 DIAGNOSIS — R7303 Prediabetes: Secondary | ICD-10-CM | POA: Diagnosis not present

## 2020-06-15 DIAGNOSIS — Z0000 Encounter for general adult medical examination without abnormal findings: Secondary | ICD-10-CM | POA: Diagnosis not present

## 2020-06-15 DIAGNOSIS — E785 Hyperlipidemia, unspecified: Secondary | ICD-10-CM | POA: Diagnosis not present

## 2020-06-17 ENCOUNTER — Other Ambulatory Visit: Payer: Self-pay | Admitting: Internal Medicine

## 2020-06-20 ENCOUNTER — Ambulatory Visit (INDEPENDENT_AMBULATORY_CARE_PROVIDER_SITE_OTHER): Payer: Medicare Other

## 2020-06-20 ENCOUNTER — Other Ambulatory Visit: Payer: Self-pay

## 2020-06-20 DIAGNOSIS — I483 Typical atrial flutter: Secondary | ICD-10-CM

## 2020-06-20 DIAGNOSIS — Z5181 Encounter for therapeutic drug level monitoring: Secondary | ICD-10-CM | POA: Diagnosis not present

## 2020-06-20 LAB — POCT INR: INR: 3 (ref 2.0–3.0)

## 2020-06-20 NOTE — Patient Instructions (Signed)
Continue with 1 tablet daily except 1/2 tablet on Mondays and Fridays. Recheck INR in 4 weeks.  Please call the Coumadin Clinic with any Coumadin related issues and with new medications/changes and procedures/surgeries: [facility] [place] [phone]

## 2020-07-05 DIAGNOSIS — H25813 Combined forms of age-related cataract, bilateral: Secondary | ICD-10-CM | POA: Diagnosis not present

## 2020-07-05 DIAGNOSIS — H35363 Drusen (degenerative) of macula, bilateral: Secondary | ICD-10-CM | POA: Diagnosis not present

## 2020-07-05 DIAGNOSIS — H353131 Nonexudative age-related macular degeneration, bilateral, early dry stage: Secondary | ICD-10-CM | POA: Diagnosis not present

## 2020-07-05 DIAGNOSIS — H5213 Myopia, bilateral: Secondary | ICD-10-CM | POA: Diagnosis not present

## 2020-07-13 DIAGNOSIS — H353132 Nonexudative age-related macular degeneration, bilateral, intermediate dry stage: Secondary | ICD-10-CM | POA: Diagnosis not present

## 2020-07-18 ENCOUNTER — Ambulatory Visit: Payer: Medicare Other

## 2020-07-18 ENCOUNTER — Other Ambulatory Visit: Payer: Self-pay

## 2020-07-18 DIAGNOSIS — Z5181 Encounter for therapeutic drug level monitoring: Secondary | ICD-10-CM

## 2020-07-18 DIAGNOSIS — I483 Typical atrial flutter: Secondary | ICD-10-CM

## 2020-07-18 LAB — POCT INR: INR: 1.9 — AB (ref 2.0–3.0)

## 2020-07-18 NOTE — Patient Instructions (Signed)
Take 2 tablets today and then Continue with 1 tablet daily except 1/2 tablet on Mondays and Fridays. Recheck INR in 4 weeks.  Please call the Coumadin Clinic with any Coumadin related issues and with new medications/changes and procedures/surgeries: [facility] [place] [phone]

## 2020-07-31 DIAGNOSIS — L550 Sunburn of first degree: Secondary | ICD-10-CM | POA: Diagnosis not present

## 2020-08-08 DIAGNOSIS — Z23 Encounter for immunization: Secondary | ICD-10-CM | POA: Diagnosis not present

## 2020-08-15 ENCOUNTER — Other Ambulatory Visit: Payer: Self-pay

## 2020-08-15 ENCOUNTER — Ambulatory Visit (INDEPENDENT_AMBULATORY_CARE_PROVIDER_SITE_OTHER): Payer: Medicare Other

## 2020-08-15 DIAGNOSIS — Z5181 Encounter for therapeutic drug level monitoring: Secondary | ICD-10-CM | POA: Diagnosis not present

## 2020-08-15 DIAGNOSIS — I483 Typical atrial flutter: Secondary | ICD-10-CM | POA: Diagnosis not present

## 2020-08-15 LAB — POCT INR: INR: 1.8 — AB (ref 2.0–3.0)

## 2020-08-15 NOTE — Patient Instructions (Signed)
Take 2 tablets today and then increase to 1 tablet daily except 1/2 tablet on Mondays. Recheck INR in 4 weeks.  Please call the Coumadin Clinic with any Coumadin related issues and with new medications/changes and procedures/surgeries: [facility] [place] [phone]

## 2020-09-06 ENCOUNTER — Telehealth: Payer: Self-pay

## 2020-09-06 NOTE — Telephone Encounter (Signed)
lmom to r/s to 10:15 for [place] coumadin

## 2020-09-13 ENCOUNTER — Telehealth: Payer: Self-pay | Admitting: *Deleted

## 2020-09-13 NOTE — Telephone Encounter (Signed)
Pt came today for coumadin check, missed his appt yesterday. Please reschedule, thanks

## 2020-09-13 NOTE — Telephone Encounter (Signed)
Called and scheduled inr appt

## 2020-09-19 ENCOUNTER — Other Ambulatory Visit: Payer: Self-pay

## 2020-09-19 ENCOUNTER — Ambulatory Visit: Payer: Medicare Other

## 2020-09-19 DIAGNOSIS — Z5181 Encounter for therapeutic drug level monitoring: Secondary | ICD-10-CM | POA: Diagnosis not present

## 2020-09-19 DIAGNOSIS — I483 Typical atrial flutter: Secondary | ICD-10-CM | POA: Diagnosis not present

## 2020-09-19 LAB — POCT INR: INR: 2.8 (ref 2.0–3.0)

## 2020-09-19 NOTE — Patient Instructions (Signed)
Continue taking 1 tablet daily except 1/2 tablet on Mondays. Recheck INR in 5 weeks.  Please call the Coumadin Clinic with any Coumadin related issues and with new medications/changes and procedures/surgeries: [facility] [place] [phone]

## 2020-10-09 DIAGNOSIS — Z20822 Contact with and (suspected) exposure to covid-19: Secondary | ICD-10-CM | POA: Diagnosis not present

## 2020-10-24 ENCOUNTER — Other Ambulatory Visit: Payer: Self-pay

## 2020-10-24 ENCOUNTER — Ambulatory Visit (INDEPENDENT_AMBULATORY_CARE_PROVIDER_SITE_OTHER): Payer: Medicare Other

## 2020-10-24 DIAGNOSIS — Z5181 Encounter for therapeutic drug level monitoring: Secondary | ICD-10-CM

## 2020-10-24 DIAGNOSIS — I483 Typical atrial flutter: Secondary | ICD-10-CM | POA: Diagnosis not present

## 2020-10-24 LAB — POCT INR: INR: 3.5 — AB (ref 2.0–3.0)

## 2020-10-24 NOTE — Patient Instructions (Signed)
Hold today and then Continue taking 1 tablet daily except 1/2 tablet on Mondays. Recheck INR in 5 weeks.  Please call the Coumadin Clinic with any Coumadin related issues and with new medications/changes and procedures/surgeries: [facility] [place] [phone]

## 2020-11-07 DIAGNOSIS — B070 Plantar wart: Secondary | ICD-10-CM | POA: Diagnosis not present

## 2020-11-07 DIAGNOSIS — B353 Tinea pedis: Secondary | ICD-10-CM | POA: Diagnosis not present

## 2020-11-14 ENCOUNTER — Ambulatory Visit (INDEPENDENT_AMBULATORY_CARE_PROVIDER_SITE_OTHER): Payer: Medicare Other | Admitting: Podiatry

## 2020-11-14 ENCOUNTER — Other Ambulatory Visit: Payer: Self-pay

## 2020-11-14 DIAGNOSIS — M79671 Pain in right foot: Secondary | ICD-10-CM | POA: Diagnosis not present

## 2020-11-14 DIAGNOSIS — M216X1 Other acquired deformities of right foot: Secondary | ICD-10-CM

## 2020-11-14 DIAGNOSIS — D492 Neoplasm of unspecified behavior of bone, soft tissue, and skin: Secondary | ICD-10-CM | POA: Diagnosis not present

## 2020-11-14 NOTE — Patient Instructions (Signed)
Keep the bandage on for 24 hours. At that time, remove and clean with soap and water. If it hurts or burns before 24 hours go ahead and remove the bandage and wash with soap and water. Keep the area clean. If there is any blistering cover with antibiotic ointment and a bandage. Monitor for any redness, drainage, or other signs of infection. Call the office if any are to occur. If you have any questions, please call the office at [phone].  
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
## 2020-11-27 ENCOUNTER — Telehealth: Payer: Self-pay

## 2020-11-27 NOTE — Telephone Encounter (Signed)
LMOM TO R/S 

## 2020-11-28 ENCOUNTER — Telehealth: Payer: Self-pay

## 2020-11-28 NOTE — Telephone Encounter (Signed)
2nd lmom to r/s coumadin appt

## 2020-11-30 ENCOUNTER — Ambulatory Visit (INDEPENDENT_AMBULATORY_CARE_PROVIDER_SITE_OTHER): Payer: Medicare Other

## 2020-11-30 ENCOUNTER — Other Ambulatory Visit: Payer: Self-pay

## 2020-11-30 ENCOUNTER — Telehealth: Payer: Self-pay | Admitting: Cardiology

## 2020-11-30 DIAGNOSIS — Z5181 Encounter for therapeutic drug level monitoring: Secondary | ICD-10-CM | POA: Diagnosis not present

## 2020-11-30 DIAGNOSIS — I483 Typical atrial flutter: Secondary | ICD-10-CM | POA: Diagnosis not present

## 2020-11-30 LAB — POCT INR: INR: 2.6 (ref 2.0–3.0)

## 2020-11-30 NOTE — Patient Instructions (Signed)
Continue taking 1 tablet daily except 1/2 tablet on Mondays. Recheck INR in 5 weeks.  Please call the Coumadin Clinic with any Coumadin related issues and with new medications/changes and procedures/surgeries: [place] [facility] [phone] [place] [address] [phone]  

## 2020-12-13 ENCOUNTER — Other Ambulatory Visit: Payer: Self-pay | Admitting: Internal Medicine

## 2021-01-02 ENCOUNTER — Other Ambulatory Visit: Payer: Self-pay

## 2021-01-02 ENCOUNTER — Ambulatory Visit (INDEPENDENT_AMBULATORY_CARE_PROVIDER_SITE_OTHER): Payer: Medicare Other

## 2021-01-02 DIAGNOSIS — Z5181 Encounter for therapeutic drug level monitoring: Secondary | ICD-10-CM | POA: Diagnosis not present

## 2021-01-02 DIAGNOSIS — I483 Typical atrial flutter: Secondary | ICD-10-CM

## 2021-01-02 LAB — POCT INR: INR: 3.3 — AB (ref 2.0–3.0)

## 2021-01-02 NOTE — Patient Instructions (Signed)
Hold today only and then Continue taking 1 tablet daily except 1/2 tablet on Mondays. Recheck INR in 6 weeks.  Please call the Coumadin Clinic with any Coumadin related issues and with new medications/changes and procedures/surgeries: [facility] [place] [phone]

## 2021-02-13 ENCOUNTER — Ambulatory Visit (INDEPENDENT_AMBULATORY_CARE_PROVIDER_SITE_OTHER): Payer: Medicare Other

## 2021-02-13 ENCOUNTER — Other Ambulatory Visit: Payer: Self-pay

## 2021-02-13 DIAGNOSIS — Z5181 Encounter for therapeutic drug level monitoring: Secondary | ICD-10-CM | POA: Diagnosis not present

## 2021-02-13 DIAGNOSIS — I483 Typical atrial flutter: Secondary | ICD-10-CM | POA: Diagnosis not present

## 2021-02-13 LAB — POCT INR: INR: 2.7 (ref 2.0–3.0)

## 2021-02-13 NOTE — Patient Instructions (Signed)
Continue taking 1 tablet daily except 1/2 tablet on Sunday. Recheck INR in 6 weeks.  Please call the Coumadin Clinic with any Coumadin related issues and with new medications/changes and procedures/surgeries: [facility] [place] [phone]

## 2021-02-15 DIAGNOSIS — L853 Xerosis cutis: Secondary | ICD-10-CM | POA: Diagnosis not present

## 2021-02-15 DIAGNOSIS — G479 Sleep disorder, unspecified: Secondary | ICD-10-CM | POA: Diagnosis not present

## 2021-02-15 DIAGNOSIS — I10 Essential (primary) hypertension: Secondary | ICD-10-CM | POA: Diagnosis not present

## 2021-02-15 DIAGNOSIS — H6061 Unspecified chronic otitis externa, right ear: Secondary | ICD-10-CM | POA: Diagnosis not present

## 2021-02-17 ENCOUNTER — Other Ambulatory Visit: Payer: Self-pay | Admitting: Internal Medicine
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
## 2021-03-02 ENCOUNTER — Encounter: Payer: Self-pay | Admitting: Physician Assistant

## 2021-03-02 ENCOUNTER — Ambulatory Visit: Payer: Medicare Other | Admitting: Physician Assistant

## 2021-03-02 ENCOUNTER — Other Ambulatory Visit: Payer: Self-pay

## 2021-03-02 VITALS — BP 144/80 | HR 55 | Ht 70.0 in | Wt 222.6 lb

## 2021-03-02 DIAGNOSIS — I10 Essential (primary) hypertension: Secondary | ICD-10-CM | POA: Diagnosis not present

## 2021-03-02 DIAGNOSIS — I420 Dilated cardiomyopathy: Secondary | ICD-10-CM

## 2021-03-02 DIAGNOSIS — I4819 Other persistent atrial fibrillation: Secondary | ICD-10-CM | POA: Diagnosis not present

## 2021-03-02 DIAGNOSIS — E785 Hyperlipidemia, unspecified: Secondary | ICD-10-CM

## 2021-03-02 MED ORDER — AMIODARONE HCL 200 MG PO TABS: 200.0000 mg | ORAL_TABLET | Freq: Every day | ORAL | 3 refills | Status: DC

## 2021-03-02 NOTE — Patient Instructions (Addendum)
Medication Instructions:   START TAKING AMIODARONE 200 MG ONCE A DAY  ONLY MONDAY THROUGH FRIDAY   *If you need a refill on your cardiac medications before your next appointment, please call your pharmacy*   Lab Work:  RETURN  AS SOON AS YOU CAN FASTING LIPIDS CMET  CBC AND TSH    If you have labs (blood work) drawn today and your tests are completely normal, you will receive your results only by: Marland Kitchen MyChart Message (if you have MyChart) OR . A paper copy in the mail If you have any lab test that is abnormal or we need to change your treatment, we will call you to review the results.   Testing/Procedures: NONE ORDERED  TODAY     Follow-Up: At [facility], you and your health needs are our priority.  As part of our continuing mission to provide you with exceptional heart care, we have created designated Provider Care Teams.  These Care Teams include your primary Cardiologist (physician) and Advanced Practice Providers (APPs -  Physician Assistants and Nurse Practitioners) who all work together to provide you with the care you need, when you need it.  We recommend signing up for the patient portal called "MyChart".  Sign up information is provided on this After Visit Summary.  MyChart is used to connect with patients for Virtual Visits (Telemedicine).  Patients are able to view lab/test results, encounter notes, upcoming appointments, etc.  Non-urgent messages can be sent to your provider as well.   To learn more about what you can do with MyChart, go to [email or web address].    Your next appointment:   1 year(s)  The format for your next appointment:   In Person  Provider:   Cristopher Peru, MD   Other Instructions

## 2021-03-05 ENCOUNTER — Other Ambulatory Visit: Payer: Medicare Other

## 2021-03-07 ENCOUNTER — Other Ambulatory Visit: Payer: Self-pay

## 2021-03-07 ENCOUNTER — Other Ambulatory Visit: Payer: Medicare Other | Admitting: *Deleted

## 2021-03-07 DIAGNOSIS — I420 Dilated cardiomyopathy: Secondary | ICD-10-CM

## 2021-03-07 DIAGNOSIS — I10 Essential (primary) hypertension: Secondary | ICD-10-CM

## 2021-03-07 DIAGNOSIS — E785 Hyperlipidemia, unspecified: Secondary | ICD-10-CM

## 2021-03-07 LAB — CBC
Hematocrit: 45.1 % (ref 37.5–51.0)
Hemoglobin: 15.8 g/dL (ref 13.0–17.7)
MCH: 30.6 pg (ref 26.6–33.0)
MCHC: 35 g/dL (ref 31.5–35.7)
MCV: 87 fL (ref 79–97)
Platelets: 221 10*3/uL (ref 150–450)
RBC: 5.17 x10E6/uL (ref 4.14–5.80)
RDW: 12 % (ref 11.6–15.4)
WBC: 6.1 10*3/uL (ref 3.4–10.8)

## 2021-03-07 LAB — COMPREHENSIVE METABOLIC PANEL
ALT: 20 IU/L (ref 0–44)
AST: 17 IU/L (ref 0–40)
Albumin/Globulin Ratio: 1.7 (ref 1.2–2.2)
Albumin: 4.3 g/dL (ref 3.7–4.7)
Alkaline Phosphatase: 89 IU/L (ref 44–121)
BUN/Creatinine Ratio: 13 (ref 10–24)
BUN: 12 mg/dL (ref 8–27)
Bilirubin Total: 0.4 mg/dL (ref 0.0–1.2)
CO2: 23 mmol/L (ref 20–29)
Calcium: 9 mg/dL (ref 8.6–10.2)
Chloride: 102 mmol/L (ref 96–106)
Creatinine, Ser: 0.92 mg/dL (ref 0.76–1.27)
Globulin, Total: 2.5 g/dL (ref 1.5–4.5)
Glucose: 96 mg/dL (ref 65–99)
Potassium: 4.3 mmol/L (ref 3.5–5.2)
Sodium: 137 mmol/L (ref 134–144)
Total Protein: 6.8 g/dL (ref 6.0–8.5)
eGFR: 86 mL/min/{1.73_m2} (ref >59)

## 2021-03-07 LAB — LIPID PANEL
Chol/HDL Ratio: 3.8 ratio (ref 0.0–5.0)
Cholesterol, Total: 225 mg/dL — ABNORMAL HIGH (ref 100–199)
HDL: 59 mg/dL (ref >39)
LDL Chol Calc (NIH): 151 mg/dL — ABNORMAL HIGH (ref 0–99)
Triglycerides: 84 mg/dL (ref 0–149)
VLDL Cholesterol Cal: 15 mg/dL (ref 5–40)

## 2021-03-07 LAB — TSH: TSH: 4.37 u[IU]/mL (ref 0.450–4.500)

## 2021-03-08 ENCOUNTER — Other Ambulatory Visit: Payer: Self-pay | Admitting: Internal Medicine

## 2021-03-14 ENCOUNTER — Other Ambulatory Visit: Payer: Medicare Other

## 2021-03-15 ENCOUNTER — Telehealth: Payer: Self-pay | Admitting: *Deleted

## 2021-03-15 NOTE — Telephone Encounter (Signed)
Multiple Attempts for patient to call back for results and  recommendations

## 2021-03-23 ENCOUNTER — Other Ambulatory Visit: Payer: Self-pay | Admitting: Internal Medicine

## 2021-03-27 ENCOUNTER — Other Ambulatory Visit: Payer: Self-pay

## 2021-03-27 ENCOUNTER — Ambulatory Visit: Payer: Medicare Other

## 2021-03-27 DIAGNOSIS — I483 Typical atrial flutter: Secondary | ICD-10-CM

## 2021-03-27 DIAGNOSIS — Z5181 Encounter for therapeutic drug level monitoring: Secondary | ICD-10-CM

## 2021-03-27 LAB — POCT INR: INR: 2.8 (ref 2.0–3.0)

## 2021-03-27 NOTE — Patient Instructions (Addendum)
Continue taking 1 tablet daily except 1/2 tablet on Sunday. Recheck INR in 5 weeks.  Please call the Coumadin Clinic with any Coumadin related issues and with new medications/changes and procedures/surgeries: [place] [facility] [phone] 

## 2021-05-01 ENCOUNTER — Other Ambulatory Visit: Payer: Self-pay

## 2021-05-01 ENCOUNTER — Ambulatory Visit: Payer: Medicare Other

## 2021-05-01 DIAGNOSIS — I483 Typical atrial flutter: Secondary | ICD-10-CM

## 2021-05-01 DIAGNOSIS — Z5181 Encounter for therapeutic drug level monitoring: Secondary | ICD-10-CM | POA: Diagnosis not present

## 2021-05-01 LAB — POCT INR: INR: 2.5 (ref 2.0–3.0)

## 2021-05-01 NOTE — Patient Instructions (Signed)
Continue taking 1 tablet daily except 1/2 tablet on Sunday. Recheck INR in 5 weeks.  Please call the Coumadin Clinic with any Coumadin related issues and with new medications/changes and procedures/surgeries: [place] [facility] [phone]

## 2021-05-29 DIAGNOSIS — H35363 Drusen (degenerative) of macula, bilateral: Secondary | ICD-10-CM | POA: Diagnosis not present

## 2021-05-29 DIAGNOSIS — H52223 Regular astigmatism, bilateral: Secondary | ICD-10-CM | POA: Diagnosis not present

## 2021-05-29 DIAGNOSIS — H353131 Nonexudative age-related macular degeneration, bilateral, early dry stage: Secondary | ICD-10-CM | POA: Diagnosis not present

## 2021-05-29 DIAGNOSIS — H25813 Combined forms of age-related cataract, bilateral: Secondary | ICD-10-CM | POA: Diagnosis not present

## 2021-06-06 ENCOUNTER — Telehealth: Payer: Self-pay | Admitting: Internal Medicine

## 2021-06-06 NOTE — Telephone Encounter (Signed)
Patient sent message stating that his heart has been out of rhythm and requested an appt with Tommye Standard. Called pt to get more information but he did not answer. I left a VM for him to call back. I have scheduled him for 08/10 with Barrington Ellison

## 2021-06-07 ENCOUNTER — Telehealth: Payer: Self-pay | Admitting: Internal Medicine

## 2021-06-07 NOTE — Telephone Encounter (Signed)
Patient c/o Palpitations:  High priority if patient c/o lightheadedness, shortness of breath, or chest pain  How long have you had palpitations/irregular HR/ Afib? Are you having the symptoms now? Been going on since sunday  Are you currently experiencing lightheadedness, SOB or CP? No  Do you have a history of afib (atrial fibrillation) or irregular heart rhythm? Yes  Have you checked your BP or HR? (document readings if available):  140/110 [id number]  Are you experiencing any other symptoms? Anxiousness

## 2021-06-07 NOTE — Telephone Encounter (Signed)
Spoke with pt and his wife. He states his BP cuff has indicated he has an abnormal rhythm since Sunday. He states initially his HR was around 110, but has since decreased into the 70's. His BP is WNL. Denies SOB, CP, edema, heart palps. He does not feel bad at this time.  Pt has an upcoming appt with Oda Kilts, PA. He was offered an earlier appt with the AF clinic but declined. He states he feels well and would like to keep his upcoming appt. We reviewed indications to contact the office back and/or when to present to the ED for emergency care.   Will send to MD and his RN for review and follow up.

## 2021-06-07 NOTE — Telephone Encounter (Signed)
Noted for appt 8/1.

## 2021-06-10 ENCOUNTER — Other Ambulatory Visit: Payer: Self-pay | Admitting: Physician Assistant

## 2021-06-11 ENCOUNTER — Telehealth: Payer: Self-pay

## 2021-06-11 ENCOUNTER — Other Ambulatory Visit: Payer: Self-pay

## 2021-06-11 ENCOUNTER — Encounter: Payer: Self-pay | Admitting: Student

## 2021-06-11 ENCOUNTER — Telehealth: Payer: Self-pay | Admitting: *Deleted

## 2021-06-11 ENCOUNTER — Ambulatory Visit: Payer: Medicare Other | Admitting: Student

## 2021-06-11 VITALS — BP 140/84 | HR 66 | Ht 70.0 in | Wt 219.0 lb

## 2021-06-11 DIAGNOSIS — I10 Essential (primary) hypertension: Secondary | ICD-10-CM

## 2021-06-11 DIAGNOSIS — I4819 Other persistent atrial fibrillation: Secondary | ICD-10-CM | POA: Diagnosis not present

## 2021-06-11 DIAGNOSIS — I420 Dilated cardiomyopathy: Secondary | ICD-10-CM

## 2021-06-11 DIAGNOSIS — I483 Typical atrial flutter: Secondary | ICD-10-CM

## 2021-06-11 LAB — CBC
Hematocrit: 47.4 % (ref 37.5–51.0)
Hemoglobin: 16 g/dL (ref 13.0–17.7)
MCH: 29.3 pg (ref 26.6–33.0)
MCHC: 33.8 g/dL (ref 31.5–35.7)
MCV: 87 fL (ref 79–97)
Platelets: 236 10*3/uL (ref 150–450)
RBC: 5.46 x10E6/uL (ref 4.14–5.80)
RDW: 11.6 % (ref 11.6–15.4)
WBC: 6.8 10*3/uL (ref 3.4–10.8)

## 2021-06-11 LAB — BASIC METABOLIC PANEL
BUN/Creatinine Ratio: 10 (ref 10–24)
BUN: 10 mg/dL (ref 8–27)
CO2: 24 mmol/L (ref 20–29)
Calcium: 9.3 mg/dL (ref 8.6–10.2)
Chloride: 102 mmol/L (ref 96–106)
Creatinine, Ser: 1.04 mg/dL (ref 0.76–1.27)
Glucose: 80 mg/dL (ref 65–99)
Potassium: 4.5 mmol/L (ref 3.5–5.2)
Sodium: 141 mmol/L (ref 134–144)
eGFR: 74 mL/min/{1.73_m2} (ref >59)

## 2021-06-11 NOTE — Telephone Encounter (Signed)
Pt was seen by Jonni Sanger, [place] in the [employer] office and was due to have INR checked after visit but wife told registration clerk that they had to go and take someone to the airport. Therefore, they left without being seen in the Anticoagulation Clinic at [facility]. Called the pt twice-left a message and called spouse number and had to leave a message. According to [place] today, the pt needs INR as he is pending Cardioversion.   Canceled appt for today and will await a call back from the pt or spouse to reschedule appt. Will forward to primary Anticoagulation Clinic staff as well.

## 2021-06-11 NOTE — Telephone Encounter (Signed)
Lpmtcb and schedule INR check for 8/2 in [place].
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
## 2021-06-11 NOTE — Patient Instructions (Signed)
Medication Instructions:  Your physician has recommended you make the following change in your medication:   AMIODARONE: Take '200mg'$  twice daily for 1 week then take '200mg'$  daily  *If you need a refill on your cardiac medications before your next appointment, please call your pharmacy*   Lab Work: TODAY: BMET, CBC  If you have labs (blood work) drawn today and your tests are completely normal, you will receive your results only by: [place] (if you have MyChart) OR A paper copy in the mail If you have any lab test that is abnormal or we need to change your treatment, we will call you to review the results.    Follow-Up: At [facility], you and your health needs are our priority.  As part of our continuing mission to provide you with exceptional heart care, we have created designated Provider Care Teams.  These Care Teams include your primary Cardiologist (physician) and Advanced Practice Providers (APPs -  Physician Assistants and Nurse Practitioners) who all work together to provide you with the care you need, when you need it.  Your next appointment:   As scheduled   Other Instructions  You are scheduled for a Cardioversion on 06/19/2021 with Dr. Stanford Breed.  Please arrive at the [facility] (Main Entrance A) at [facility]: [address] [place], [place] [address] at 7:00 am.   DIET: Nothing to eat or drink after midnight except a sip of water with medications (see medication instructions below)  FYI: For your safety, and to allow [place] to monitor your vital signs accurately during the surgery/procedure we request that   if you have artificial nails, gel coating, SNS etc. Please have those removed prior to your surgery/procedure. Not having the nail coverings /polish removed may result in cancellation or delay of your surgery/procedure.   Medication Instructions:  Continue your anticoagulant: Warfarin You will need to continue your anticoagulant after your procedure  until you  are told by your  Provider that it is safe to stop   Labs: TODAY: BMET, CBC  You must have a responsible person to drive you home and stay in the waiting area during your procedure. Failure to do so could result in cancellation.  Bring your insurance cards.  *Special Note: Every effort is made to have your procedure done on time. Occasionally there are emergencies that occur at the hospital that may cause delays. Please be patient if a delay does occur.

## 2021-06-12 ENCOUNTER — Ambulatory Visit (INDEPENDENT_AMBULATORY_CARE_PROVIDER_SITE_OTHER): Payer: Medicare Other

## 2021-06-12 DIAGNOSIS — Z5181 Encounter for therapeutic drug level monitoring: Secondary | ICD-10-CM | POA: Diagnosis not present

## 2021-06-12 DIAGNOSIS — I483 Typical atrial flutter: Secondary | ICD-10-CM

## 2021-06-12 LAB — POCT INR: INR: 2.9 (ref 2.0–3.0)

## 2021-06-12 NOTE — Telephone Encounter (Signed)
Prescription refill request received for warfarin Lov: 06/11/21/w/tillery Next INR check: today  Warfarin tablet strength:'5mg'$ 

## 2021-06-12 NOTE — Patient Instructions (Signed)
Continue taking 1 tablet daily except 1/2 tablet on Sunday. Recheck INR in 6 weeks.  Please call the Coumadin Clinic with any Coumadin related issues and with new medications/changes and procedures/surgeries: [facility] [phone].  Cardioversion 8/11.

## 2021-06-14 ENCOUNTER — Other Ambulatory Visit: Payer: Self-pay | Admitting: Physician Assistant

## 2021-06-20 ENCOUNTER — Ambulatory Visit: Payer: Medicare Other | Admitting: Student

## 2021-06-21 ENCOUNTER — Ambulatory Visit (HOSPITAL_COMMUNITY)
Admission: RE | Admit: 2021-06-21 | Discharge: 2021-06-21 | Disposition: A | Discharge status: Home / Self Care | Payer: Medicare Other | Attending: Cardiovascular Disease | Admitting: Cardiovascular Disease

## 2021-06-21 ENCOUNTER — Encounter (HOSPITAL_COMMUNITY)
Admission: RE | Disposition: A | Discharge status: Home / Self Care | Payer: Self-pay | Source: Home / Self Care | Attending: Cardiovascular Disease

## 2021-06-21 ENCOUNTER — Other Ambulatory Visit: Payer: Self-pay

## 2021-06-21 ENCOUNTER — Ambulatory Visit (HOSPITAL_COMMUNITY): Payer: Medicare Other | Admitting: Anesthesiology

## 2021-06-21 ENCOUNTER — Encounter (HOSPITAL_COMMUNITY): Payer: Self-pay | Admitting: Cardiovascular Disease

## 2021-06-21 DIAGNOSIS — I4819 Other persistent atrial fibrillation: Secondary | ICD-10-CM | POA: Insufficient documentation

## 2021-06-21 DIAGNOSIS — Z6831 Body mass index (BMI) 31.0-31.9, adult: Secondary | ICD-10-CM | POA: Insufficient documentation

## 2021-06-21 DIAGNOSIS — Z7901 Long term (current) use of anticoagulants: Secondary | ICD-10-CM | POA: Insufficient documentation

## 2021-06-21 DIAGNOSIS — Z87891 Personal history of nicotine dependence: Secondary | ICD-10-CM | POA: Insufficient documentation

## 2021-06-21 DIAGNOSIS — I4891 Unspecified atrial fibrillation: Secondary | ICD-10-CM

## 2021-06-21 DIAGNOSIS — I110 Hypertensive heart disease with heart failure: Secondary | ICD-10-CM | POA: Diagnosis not present

## 2021-06-21 DIAGNOSIS — I10 Essential (primary) hypertension: Secondary | ICD-10-CM | POA: Insufficient documentation

## 2021-06-21 DIAGNOSIS — E669 Obesity, unspecified: Secondary | ICD-10-CM | POA: Diagnosis not present

## 2021-06-21 DIAGNOSIS — E785 Hyperlipidemia, unspecified: Secondary | ICD-10-CM | POA: Diagnosis not present

## 2021-06-21 DIAGNOSIS — G4733 Obstructive sleep apnea (adult) (pediatric): Secondary | ICD-10-CM | POA: Insufficient documentation

## 2021-06-21 DIAGNOSIS — Z79899 Other long term (current) drug therapy: Secondary | ICD-10-CM | POA: Insufficient documentation

## 2021-06-21 DIAGNOSIS — I4892 Unspecified atrial flutter: Secondary | ICD-10-CM | POA: Insufficient documentation

## 2021-06-21 DIAGNOSIS — Z8546 Personal history of malignant neoplasm of prostate: Secondary | ICD-10-CM | POA: Diagnosis not present

## 2021-06-21 DIAGNOSIS — I5042 Chronic combined systolic (congestive) and diastolic (congestive) heart failure: Secondary | ICD-10-CM | POA: Diagnosis not present

## 2021-06-21 HISTORY — PX: CARDIOVERSION: SHX1299

## 2021-06-21 LAB — POCT I-STAT, CHEM 8
BUN: 19 mg/dL (ref 8–23)
Calcium, Ion: 0.79 mmol/L — CL (ref 1.15–1.40)
Chloride: 111 mmol/L (ref 98–111)
Creatinine, Ser: 0.9 mg/dL (ref 0.61–1.24)
Glucose, Bld: 111 mg/dL — ABNORMAL HIGH (ref 70–99)
HCT: 46 % (ref 39.0–52.0)
Hemoglobin: 15.6 g/dL (ref 13.0–17.0)
Potassium: 5.3 mmol/L — ABNORMAL HIGH (ref 3.5–5.1)
Sodium: 134 mmol/L — ABNORMAL LOW (ref 135–145)
TCO2: 25 mmol/L (ref 22–32)

## 2021-06-21 SURGERY — CARDIOVERSION
Anesthesia: General

## 2021-06-21 MED ORDER — SODIUM CHLORIDE 0.9 % IV SOLN: INTRAVENOUS | Status: DC | PRN

## 2021-06-21 MED ORDER — PROPOFOL 10 MG/ML IV BOLUS
INTRAVENOUS | Status: DC | PRN
  Administered 2021-06-21: 80 mg via INTRAVENOUS

## 2021-06-21 MED ORDER — LIDOCAINE 2% (20 MG/ML) 5 ML SYRINGE
INTRAMUSCULAR | Status: DC | PRN
  Administered 2021-06-21: 60 mg via INTRAVENOUS

## 2021-06-21 NOTE — Anesthesia Preprocedure Evaluation (Addendum)
Anesthesia Evaluation  Patient identified by MRN, date of birth, ID band Patient awake    Reviewed: Allergy & Precautions, NPO status , Patient's Chart, lab work & pertinent test results, reviewed documented beta blocker date and time   Airway Mallampati: III  TM Distance: >3 FB Neck ROM: Full    Dental no notable dental hx. (+) Teeth Intact, Dental Advisory Given,    Pulmonary sleep apnea (refused CPAP) , former smoker,  Quit smoking 2011, 50 pack year history    Pulmonary exam normal breath sounds clear to auscultation       Cardiovascular hypertension, Pt. on medications and Pt. on home beta blockers Normal cardiovascular exam+ dysrhythmias Atrial Fibrillation  Rhythm:Regular Rate:Normal  Echo 2019: - Left ventricle: The cavity size was normal. Wall thickness was  increased in a pattern of mild LVH. Systolic function was normal.  The estimated ejection fraction was in the range of 55% to 60%.  Wall motion was normal; there were no regional wall motion  abnormalities.  - Aortic valve: There was no stenosis. There was trivial  regurgitation.  - Aorta: Normal caliber aorta with minimal plaque.  - Mitral valve: There was trivial regurgitation.  - Left atrium: The atrium was moderately dilated. No evidence of  thrombus in the atrial cavity or appendage.  - Right ventricle: The cavity size was normal. Systolic function  was normal.  - Right atrium: Mild right atrial enlargement, Chiari network  noted.  - Atrial septum: No ASD/PFO by color doppler.    Neuro/Psych negative neurological ROS  negative psych ROS   GI/Hepatic negative GI ROS, (+)     substance abuse  alcohol use,   Endo/Other  Obesity BMI 31  Renal/GU negative Renal ROS  negative genitourinary   Musculoskeletal  (+) Arthritis , Osteoarthritis,    Abdominal (+) + obese,   Peds  Hematology negative hematology ROS (+)   Anesthesia Other  Findings   Reproductive/Obstetrics negative OB ROS                            Anesthesia Physical Anesthesia Plan  ASA: 3  Anesthesia Plan: General   Post-op Pain Management:    Induction: Intravenous  PONV Risk Score and Plan: TIVA and Treatment may vary due to age or medical condition  Airway Management Planned: Natural Airway and Mask  Additional Equipment: None  Intra-op Plan:   Post-operative Plan:   Informed Consent: I have reviewed the patients History and Physical, chart, labs and discussed the procedure including the risks, benefits and alternatives for the proposed anesthesia with the patient or authorized representative who has indicated his/her understanding and acceptance.     Dental advisory given  Plan Discussed with: CRNA  Anesthesia Plan Comments:        Anesthesia Quick Evaluation

## 2021-06-21 NOTE — CV Procedure (Signed)
DCC: On Rx coumadin Anesthesia: Propofol  DCC x 1 150 J biphasic Converted from atypical flutter rate 98 to NSR rate 62 bpm  No immediate neurologic sequelae  Jenkins Rouge MD [facility]

## 2021-06-21 NOTE — Discharge Instructions (Signed)

## 2021-06-21 NOTE — Anesthesia Postprocedure Evaluation (Signed)
Anesthesia Post Note  Patient: Travis Chase  Procedure(s) Performed: CARDIOVERSION     Patient location during evaluation: PACU Anesthesia Type: General Level of consciousness: awake and alert, oriented and patient cooperative Pain management: pain level controlled Vital Signs Assessment: post-procedure vital signs reviewed and stable Respiratory status: spontaneous breathing, nonlabored ventilation and respiratory function stable Cardiovascular status: blood pressure returned to baseline and stable Postop Assessment: no apparent nausea or vomiting Anesthetic complications: no   No notable events documented.  Last Vitals:  Vitals:   06/21/21 0824 06/21/21 0838  BP: 123/61 (!) 143/97  Pulse: (!) 55 (!) 58  Resp: 17 16  Temp: 36.5 C   SpO2: 98% 96%    Last Pain:  Vitals:   06/21/21 0838  TempSrc:   PainSc: 0-No pain                 Pervis Hocking

## 2021-06-21 NOTE — Transfer of Care (Signed)
Immediate Anesthesia Transfer of Care Note  Patient: Travis Chase  Procedure(s) Performed: CARDIOVERSION  Patient Location: Endoscopy Unit  Anesthesia Type:General  Level of Consciousness: awake, alert  and oriented  Airway & Oxygen Therapy: Patient Spontanous Breathing  Post-op Assessment: Report given to RN and Post -op Vital signs reviewed and stable  Post vital signs: Reviewed and stable  Last Vitals:  Vitals Value Taken Time  BP    Temp    Pulse    Resp    SpO2      Last Pain:  Vitals:   06/21/21 0711  TempSrc: Oral  PainSc: 0-No pain         Complications: No notable events documented.

## 2021-06-21 NOTE — Anesthesia Procedure Notes (Signed)
Procedure Name: General with mask airway Date/Time: 06/21/2021 8:15 AM Performed by: Griffin Dakin, CRNA Pre-anesthesia Checklist: Patient identified, Emergency Drugs available, Suction available, Patient being monitored and Timeout performed Patient Re-evaluated:Patient Re-evaluated prior to induction Oxygen Delivery Method: Ambu bag Preoxygenation: Pre-oxygenation with 100% oxygen Induction Type: IV induction Ventilation: Mask ventilation without difficulty Dental Injury: Teeth and Oropharynx as per pre-operative assessment

## 2021-06-21 NOTE — Interval H&P Note (Signed)
History and Physical Interval Note:  06/21/2021 7:19 AM  [name]  has presented today for surgery, with the diagnosis of AFIB.  The various methods of treatment have been discussed with the patient and family. After consideration of risks, benefits and other options for treatment, the patient has consented to  Procedure(s): CARDIOVERSION (N/A) as a surgical intervention.  The patient's history has been reviewed, patient examined, no change in status, stable for surgery.  I have reviewed the patient's chart and labs.  Questions were answered to the patient's satisfaction.     Travis Chase

## 2021-06-22 ENCOUNTER — Encounter (HOSPITAL_COMMUNITY): Payer: Self-pay | Admitting: Cardiovascular Disease

## 2021-07-02 DIAGNOSIS — Z0000 Encounter for general adult medical examination without abnormal findings: Secondary | ICD-10-CM | POA: Diagnosis not present

## 2021-07-02 DIAGNOSIS — H6121 Impacted cerumen, right ear: Secondary | ICD-10-CM | POA: Diagnosis not present

## 2021-07-02 DIAGNOSIS — R7303 Prediabetes: Secondary | ICD-10-CM | POA: Diagnosis not present

## 2021-07-02 DIAGNOSIS — I10 Essential (primary) hypertension: Secondary | ICD-10-CM | POA: Diagnosis not present

## 2021-07-19 ENCOUNTER — Telehealth: Payer: Self-pay | Admitting: Internal Medicine

## 2021-07-19 NOTE — Telephone Encounter (Signed)
Travis Chase is calling stating his insurance denied him getting the cardioversion that is scheduled. He states they advised that they are sending our office a letter in regards to it. Would like to know if we will be appealing this for him.

## 2021-07-20 NOTE — Telephone Encounter (Signed)
Message forwarded to precert department and billing department for review.

## 2021-07-26 NOTE — Telephone Encounter (Signed)
Message  Good Morning [place],    I have reviewed this patient's account and his cardioversion on 06/21/21 was paid on 07/05/21. I have left a message with this patient to give our billing department a call.     Thank you,  Alta Corning

## 2021-07-27 ENCOUNTER — Ambulatory Visit: Payer: Medicare Other | Admitting: *Deleted

## 2021-07-27 ENCOUNTER — Ambulatory Visit: Payer: Medicare Other | Admitting: Internal Medicine

## 2021-07-27 ENCOUNTER — Other Ambulatory Visit: Payer: Self-pay

## 2021-07-27 VITALS — BP 154/92 | HR 98 | Ht 69.5 in | Wt 223.0 lb

## 2021-07-27 DIAGNOSIS — I483 Typical atrial flutter: Secondary | ICD-10-CM

## 2021-07-27 DIAGNOSIS — I484 Atypical atrial flutter: Secondary | ICD-10-CM

## 2021-07-27 DIAGNOSIS — Z5181 Encounter for therapeutic drug level monitoring: Secondary | ICD-10-CM

## 2021-07-27 LAB — POCT INR: INR: 4.2 — AB (ref 2.0–3.0)

## 2021-07-27 MED ORDER — RIVAROXABAN 20 MG PO TABS
20.0000 mg | ORAL_TABLET | Freq: Every day | ORAL | 5 refills | Status: DC
Start: 2021-07-29 — End: 2022-01-18

## 2021-07-27 NOTE — Patient Instructions (Signed)
Medication Instructions:  Your physician has recommended you make the following change in your medication:  STOP Coumadin START Xarelto 20 mg once daily  *If you need a refill on your cardiac medications before your next appointment, please call your pharmacy*   Lab Work: None ordered   Testing/Procedures: Your physician has recommended that you have a sleep study. This test records several body functions during sleep, including: brain activity, eye movement, oxygen and carbon dioxide blood levels, heart rate and rhythm, breathing rate and rhythm, the flow of air through your mouth and nose, snoring, body muscle movements, and chest and belly movement.  Your physician has requested that you have an echocardiogram. Echocardiography is a painless test that uses sound waves to create images of your heart. It provides your doctor with information about the size and shape of your heart and how well your heart's chambers and valves are working. This procedure takes approximately one hour. There are no restrictions for this procedure   Follow-Up: At [facility], you and your health needs are our priority.  As part of our continuing mission to provide you with exceptional heart care, we have created designated Provider Care Teams.  These Care Teams include your primary Cardiologist (physician) and Advanced Practice Providers (APPs -  Physician Assistants and Nurse Practitioners) who all work together to provide you with the care you need, when you need it.  Your next appointment:   2 - 3 month(s)  The format for your next appointment:   In Person  Provider:   Allegra Lai, MD    Thank you for choosing [facility]!!   Trinidad Curet, RN [phone]   Other Instructions

## 2021-07-27 NOTE — Patient Instructions (Addendum)
Description   Stop warfarin. On 9/18 start Xarelto. Take 1 tablet my mouth daily with supper or your largest meal of the day.
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
## 2021-08-02 ENCOUNTER — Telehealth: Payer: Self-pay | Admitting: *Deleted

## 2021-08-02 NOTE — Telephone Encounter (Signed)
Pt called today and states he is waiting for the PIN# for [facility] Sleep Study. Pt was give sleep study 07/27/21 by Rico Junker. RMA. I did explain to the pt that I was on vacation, though I will be happy to send a message to sleep pool to see if [facility] Study needs a PA. I assured the pt that I will call him back hopefully later today with PIN #. Pt thanked me for the call and the help.    Message sent to our Sleep Pool, [place], Gershon Cull, CMA: Hey ladies can someone please look at this pt as the pt just called and is waiting for the PIN#. I was on vacation when this was done. Rico Junker. gave the pt the study, though I am not sure if she sent you all a message if a PA is needed. Can someone please check on this for the pt. I appreciate your help in this matter.

## 2021-08-03 NOTE — Telephone Encounter (Signed)
PA is not required for Itamar sleep study ref#[id number], pt has benefit. Julaine Hua notified.

## 2021-08-03 NOTE — Telephone Encounter (Signed)
Pt has been given PIN# [id number] to proceed with [facility] sleep study. Pt  will do sleep study with in the next few days. Pt is grateful my call today. Pt aware once study has been read by cardiologist, we will call him with the results. Pt again, said thank you.

## 2021-08-05 ENCOUNTER — Encounter (INDEPENDENT_AMBULATORY_CARE_PROVIDER_SITE_OTHER): Payer: Medicare Other | Admitting: Cardiology

## 2021-08-05 DIAGNOSIS — G4733 Obstructive sleep apnea (adult) (pediatric): Secondary | ICD-10-CM | POA: Diagnosis not present

## 2021-08-16 ENCOUNTER — Ambulatory Visit (HOSPITAL_COMMUNITY): Payer: Medicare Other | Attending: Cardiology

## 2021-08-16 ENCOUNTER — Other Ambulatory Visit: Payer: Self-pay

## 2021-08-16 DIAGNOSIS — I484 Atypical atrial flutter: Secondary | ICD-10-CM | POA: Insufficient documentation

## 2021-08-16 LAB — ECHOCARDIOGRAM COMPLETE: S' Lateral: 2.7 cm

## 2021-09-02 NOTE — Procedures (Signed)
   Sleep Study Report  Patient Information Study Date: 08/05/21 Patient Name: Travis Chase Patient ID: [id number] Birth Date: Sep 12, 2046 Age: 75 Gender: Male BMI: 33.0 (W=222 lb, H=5' 9'') Referring Physician: Cristopher Peru, MD  TEST DESCRIPTION: Home sleep apnea testing was completed using the WatchPat, a Type 1 device, utilizing peripheral arterial tonometry (PAT), chest movement, actigraphy, pulse oximetry, pulse rate, body position and snore. AHI was calculated with apnea and hypopnea using valid sleep time as the denominator. RDI includes apneas, hypopneas, and RERAs. The data acquired and the scoring of sleep and all associated events were performed in accordance with the recommended standards and specifications as outlined in the AASM Manual for the Scoring of Sleep and Associated Events 2.2.0 (2015).  FINDINGS: 1. Severe Obstructive Sleep Apnea with AHI 47.9/hr. 2. Mild Central Sleep Apnea with pAHIc 14.8/hr. 3. Oxygen desaturations as low as 69%. 4. Moderate snoring was present. O2 sats were < 88% for 2 min. 5. Total sleep time was 4 hrs and 48 min. 6. 0% of total sleep time was spent in REM sleep. 7. Shortened sleep onset latency at 6 min. 8. No REM sleep. 9. Total awakenings were 15.  DIAGNOSIS: Severe Obstructive Sleep Apnea (G47.33)  RECOMMENDATIONS: 1. Clinical correlation of these findings is necessary. The decision to treat obstructive sleep apnea (OSA) is usually based on the presence of apnea symptoms or the presence of associated medical conditions such as Hypertension, Congestive Heart Failure, Atrial Fibrillation or Obesity. The most common symptoms of OSA are snoring, gasping for breath while sleeping, daytime sleepiness and fatigue.  2. Initiating apnea therapy is recommended given the presence of symptoms and/or associated conditions. Recommend proceeding with one of the following:   a. Auto-CPAP therapy with a pressure range of 5-20cm H2O.   b. An  oral appliance (OA) that can be obtained from certain dentists with expertise in sleep medicine. These are primarily of use in non-obese patients with mild and moderate disease.   c. An ENT consultation which may be useful to look for specific causes of obstruction and possible treatment options.   d. If patient is intolerant to PAP therapy, consider referral to ENT for evaluation for hypoglossal nerve stimulator.  3. Close follow-up is necessary to ensure success with CPAP or oral appliance therapy for maximum benefit .  4. A follow-up oximetry study on CPAP is recommended to assess the adequacy of therapy and determine the need for supplemental oxygen or the potential need for Bi-level therapy. An arterial blood gas to determine the adequacy of baseline ventilation and oxygenation should also be considered.  5. Healthy sleep recommendations include: adequate nightly sleep (normal 7-9 hrs/night), avoidance of caffeine after noon and alcohol near bedtime, and maintaining a sleep environment that is cool, dark and quiet. 6. Weight loss for overweight patients is recommended. Even modest amounts of weight loss can signific antly improve the severity of sleep apnea.  7. Snoring recommendations include: weight loss where appropriate, side sleeping, and avoidance of alcohol before bed.  8. Operation of motor vehicle should be avoided when sleepy.  Signature: Electronically Signed: 09/03/21 Fransico Him, MD; [facility]; [place], American Board of Sleep Medicine

## 2021-09-03 ENCOUNTER — Ambulatory Visit: Payer: Medicare Other

## 2021-09-03 DIAGNOSIS — I484 Atypical atrial flutter: Secondary | ICD-10-CM

## 2021-09-05 ENCOUNTER — Telehealth: Payer: Self-pay | Admitting: *Deleted

## 2021-09-05 ENCOUNTER — Other Ambulatory Visit: Payer: Self-pay | Admitting: Student

## 2021-09-05 DIAGNOSIS — G4733 Obstructive sleep apnea (adult) (pediatric): Secondary | ICD-10-CM

## 2021-09-05 NOTE — Telephone Encounter (Signed)
Prescription refill request received for warfarin Per Dr Lovena Le note on 07/27/21, pt stopped Warfarin and started Xarelto.

## 2021-09-05 NOTE — Telephone Encounter (Signed)
-----   Message from Sueanne Margarita, MD sent at 09/02/2021  8:51 PM EDT ----- Please let patient know that they have sleep apnea.  Recommend therapeutic CPAP titration for treatment of patient's sleep disordered breathing.  If unable to perform an in lab titration then initiate ResMed auto CPAP from 4 to 15cm H2O with heated humidity and mask of choice and overnight pulse ox on CPAP.

## 2021-09-20 NOTE — Telephone Encounter (Addendum)
Prior Authorization for titration sent to [facility] via web portal. Tracking Number   do not require Pre-Authorization by [place].

## 2021-09-25 NOTE — Telephone Encounter (Signed)
Patient is scheduled for CPAP Titration on 11/07/21. Patient understands his titration study will be done at [facility] sleep lab. Patient understands he will receive a letter in a week or so detailing appointment, date, time, and location. Patient understands to call if he does not receive the letter  in a timely manner. Patient agrees with treatment and thanked me for call.

## 2021-10-09 ENCOUNTER — Ambulatory Visit: Payer: Medicare Other | Admitting: Internal Medicine

## 2021-10-09 ENCOUNTER — Other Ambulatory Visit: Payer: Self-pay

## 2021-10-09 VITALS — BP 130/82 | HR 106 | Ht 69.5 in | Wt 226.0 lb

## 2021-10-09 DIAGNOSIS — I10 Essential (primary) hypertension: Secondary | ICD-10-CM

## 2021-10-09 DIAGNOSIS — I4819 Other persistent atrial fibrillation: Secondary | ICD-10-CM | POA: Diagnosis not present

## 2021-10-09 MED ORDER — METOPROLOL TARTRATE 50 MG PO TABS: 50.0000 mg | ORAL_TABLET | Freq: Two times a day (BID) | ORAL | 3 refills | Status: DC
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
## 2021-10-09 NOTE — Patient Instructions (Addendum)
Medication Instructions:  Your physician has recommended you make the following change in your medication:    STOP amiodarone  2.  INCREASE your  metoprolol tartrate-  Take 50 mg by mouth twice a day   Labwork: None ordered.  Testing/Procedures: None ordered.  Follow-Up: Your physician wants you to follow-up in: 3 months with Legrand Como "Jonni Sanger" Chalmers Cater, PA-C   Any Other Special Instructions Will Be Listed Below (If Applicable).  If you need a refill on your cardiac medications before your next appointment, please call your pharmacy.

## 2021-11-07 ENCOUNTER — Ambulatory Visit (HOSPITAL_BASED_OUTPATIENT_CLINIC_OR_DEPARTMENT_OTHER): Payer: Medicare Other | Attending: Cardiology | Admitting: Cardiology

## 2021-11-07 ENCOUNTER — Other Ambulatory Visit: Payer: Self-pay

## 2021-11-07 VITALS — Ht 70.0 in | Wt 220.0 lb

## 2021-11-07 DIAGNOSIS — G4733 Obstructive sleep apnea (adult) (pediatric): Secondary | ICD-10-CM | POA: Diagnosis not present

## 2021-11-18 NOTE — Procedures (Signed)
° °  Patient Name: Travis Chase, Travis Chase Date: 11/07/2021 Gender: Male D.O.B: June 22, 1946 Age (years): 64 Referring Provider: Fransico Him MD, ABSM Height (inches): 70 Interpreting Physician: Fransico Him MD, ABSM Weight (lbs): 220 RPSGT: Zadie Rhine BMI: 32 MRN: [id number] Neck Size: 16.00  CLINICAL INFORMATION The patient is referred for a CPAP titration to treat sleep apnea.  SLEEP STUDY TECHNIQUE As per the AASM Manual for the Scoring of Sleep and Associated Events v2.3 (April 2016) with a hypopnea requiring 4% desaturations.  The channels recorded and monitored were frontal, central and occipital EEG, electrooculogram (EOG), submentalis EMG (chin), nasal and oral airflow, thoracic and abdominal wall motion, anterior tibialis EMG, snore microphone, electrocardiogram, and pulse oximetry. Continuous positive airway pressure (CPAP) was initiated at the beginning of the study and titrated to treat sleep-disordered breathing.  MEDICATIONS Medications self-administered by patient taken the night of the study : N/A  TECHNICIAN COMMENTS Comments added by technician: one restroom visted Comments added by scorer: N/A  RESPIRATORY PARAMETERS Optimal PAP Pressure (cm): 13  AHI at Optimal Pressure (/hr):0 Overall Minimal O2 (%):79.0  Supine % at Optimal Pressure (%):100 Minimal O2 at Optimal Pressure (%): 93.0   SLEEP ARCHITECTURE The study was initiated at 10:37:40 PM and ended at 5:03:15 AM.  Sleep onset time was 4.2 minutes and the sleep efficiency was 87.6%. The total sleep time was 337.9 minutes.  The patient spent 6.7% of the night in stage N1 sleep, 58.9% in stage N2 sleep, 0.0% in stage N3 and 34.5% in REM.Stage REM latency was 36.5 minutes  Wake after sleep onset was 43.5. Alpha intrusion was absent. Supine sleep was 41.41%.  CARDIAC DATA The 2 lead EKG demonstrated sinus rhythm. The mean heart rate was 103.0 beats per minute. Other EKG findings include: None.  LEG  MOVEMENT DATA The total Periodic Limb Movements of Sleep (PLMS) were 0. The PLMS index was 0.0. A PLMS index of <15 is considered normal in adults.  IMPRESSIONS - The optimal PAP pressure was 13 cm of water. - Central sleep apnea was not noted during this titration (CAI = 1.6/h). - Severe oxygen desaturations were observed during this titration (min O2 = 79.0%). - No snoring was audible during this study. - No cardiac abnormalities were observed during this study. - Clinically significant periodic limb movements were not noted during this study. Arousals associated with PLMs were rare.  DIAGNOSIS - Obstructive Sleep Apnea (G47.33)  RECOMMENDATIONS - Trial of CPAP therapy on 13 cm H2O with a Large size Resmed Full Face Mask Mirage Quattro mask and heated humidification. - Avoid alcohol, sedatives and other CNS depressants that may worsen sleep apnea and disrupt normal sleep architecture. - Sleep hygiene should be reviewed to assess factors that may improve sleep quality. - Weight management and regular exercise should be initiated or continued. - Return to Sleep Center for re-evaluation after 6 weeks of therapy  [Electronically signed] 11/18/2021 07:25 PM  Fransico Him MD, ABSM Diplomate, American Board of Sleep Medicine

## 2021-12-10 ENCOUNTER — Telehealth: Payer: Self-pay | Admitting: *Deleted

## 2021-12-10 NOTE — Telephone Encounter (Signed)
-----   Message from Sueanne Margarita, MD sent at 11/18/2021  7:28 PM EST ----- Please let patient know that they had a successful PAP titration and let DME know that orders are in EPIC.  Please set up 6 week OV with me.

## 2021-12-10 NOTE — Telephone Encounter (Addendum)
The patient has been notified of the result and verbalized understanding.  All questions (if any) were answered. Travis Chase, [name] 12/10/2021 5:07 PM    Patient has declined to move forward with a cpap and will speak to his cardiologist at his next appointment.

## 2021-12-19 ENCOUNTER — Other Ambulatory Visit: Payer: Self-pay | Admitting: Internal Medicine

## 2021-12-19 DIAGNOSIS — I483 Typical atrial flutter: Secondary | ICD-10-CM

## 2021-12-19 DIAGNOSIS — Z5181 Encounter for therapeutic drug level monitoring: Secondary | ICD-10-CM

## 2022-01-08 ENCOUNTER — Ambulatory Visit: Payer: Medicare Other | Admitting: Student

## 2022-01-18 ENCOUNTER — Other Ambulatory Visit: Payer: Self-pay | Admitting: Internal Medicine

## 2022-01-18 NOTE — Telephone Encounter (Signed)
Xarelto 20 mg refill request received. Pt is 76 years old, weight- 99.8 kg, Crea- 1.04 on 06/11/21, last seen by Dr. Lovena Le on 10/09/21, Diagnosis- afib, CrCl- 83.97; Dose is appropriate based on dosing criteria. Will send in refill to requested pharmacy.    ?

## 2022-02-04 ENCOUNTER — Ambulatory Visit: Payer: Medicare Other | Admitting: Student

## 2022-02-12 DIAGNOSIS — H353132 Nonexudative age-related macular degeneration, bilateral, intermediate dry stage: Secondary | ICD-10-CM | POA: Diagnosis not present

## 2022-02-12 DIAGNOSIS — H2513 Age-related nuclear cataract, bilateral: Secondary | ICD-10-CM | POA: Diagnosis not present
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
## 2022-02-18 ENCOUNTER — Ambulatory Visit: Payer: Medicare Other | Admitting: Student

## 2022-02-18 ENCOUNTER — Encounter: Payer: Self-pay | Admitting: Student

## 2022-02-18 VITALS — BP 122/80 | HR 116 | Ht 70.0 in | Wt 225.0 lb

## 2022-02-18 DIAGNOSIS — G4733 Obstructive sleep apnea (adult) (pediatric): Secondary | ICD-10-CM

## 2022-02-18 DIAGNOSIS — I10 Essential (primary) hypertension: Secondary | ICD-10-CM | POA: Diagnosis not present

## 2022-02-18 DIAGNOSIS — I4819 Other persistent atrial fibrillation: Secondary | ICD-10-CM

## 2022-02-18 DIAGNOSIS — I483 Typical atrial flutter: Secondary | ICD-10-CM | POA: Diagnosis not present

## 2022-02-18 LAB — CBC
Hematocrit: 45.6 % (ref 37.5–51.0)
Hemoglobin: 15.6 g/dL (ref 13.0–17.7)
MCH: 29.9 pg (ref 26.6–33.0)
MCHC: 34.2 g/dL (ref 31.5–35.7)
MCV: 88 fL (ref 79–97)
Platelets: 238 10*3/uL (ref 150–450)
RBC: 5.21 x10E6/uL (ref 4.14–5.80)
RDW: 11.5 % — ABNORMAL LOW (ref 11.6–15.4)
WBC: 6.8 10*3/uL (ref 3.4–10.8)

## 2022-02-18 LAB — BASIC METABOLIC PANEL
BUN/Creatinine Ratio: 16 (ref 10–24)
BUN: 14 mg/dL (ref 8–27)
CO2: 23 mmol/L (ref 20–29)
Calcium: 9.4 mg/dL (ref 8.6–10.2)
Chloride: 104 mmol/L (ref 96–106)
Creatinine, Ser: 0.86 mg/dL (ref 0.76–1.27)
Glucose: 91 mg/dL (ref 70–99)
Potassium: 4.6 mmol/L (ref 3.5–5.2)
Sodium: 138 mmol/L (ref 134–144)
eGFR: 89 mL/min/{1.73_m2} (ref >59)

## 2022-02-18 NOTE — Patient Instructions (Signed)
Medication Instructions:  ?Your physician recommends that you continue on your current medications as directed. Please refer to the Current Medication list given to you today. ? ?*If you need a refill on your cardiac medications before your next appointment, please call your pharmacy* ? ? ?Lab Work: ?TODAY: BMET, CBC ? ?If you have labs (blood work) drawn today and your tests are completely normal, you will receive your results only by: ?MyChart Message (if you have MyChart) OR ?A paper copy in the mail ?If you have any lab test that is abnormal or we need to change your treatment, we will call you to review the results. ? ? ?Testing/Procedures: ?Your physician has requested that you have an echocardiogram in July. Echocardiography is a painless test that uses sound waves to create images of your heart. It provides your doctor with information about the size and shape of your heart and how well your heart?s chambers and valves are working. This procedure takes approximately one hour. There are no restrictions for this procedure. ? ?Follow-Up: ?At [facility], you and your health needs are our priority.  As part of our continuing mission to provide you with exceptional heart care, we have created designated Provider Care Teams.  These Care Teams include your primary Cardiologist (physician) and Advanced Practice Providers (APPs -  Physician Assistants and Nurse Practitioners) who all work together to provide you with the care you need, when you need it. ? ?We recommend signing up for the patient portal called "MyChart".  Sign up information is provided on this After Visit Summary.  MyChart is used to connect with patients for Virtual Visits (Telemedicine).  Patients are able to view lab/test results, encounter notes, upcoming appointments, etc.  Non-urgent messages can be sent to your provider as well.   ?To learn more about what you can do with MyChart, go to [email or web address].   ? ?Your next appointment:    ?6 month(s) ? ?The format for your next appointment:   ?In Person ? ?Provider:   ?Legrand Como "Jonni Sanger" Joie Reamer, PA-C  ? ?Important Information About Sugar ? ? ? ? ?  ?

## 2022-02-19 DIAGNOSIS — Z7901 Long term (current) use of anticoagulants: Secondary | ICD-10-CM | POA: Diagnosis not present

## 2022-02-19 DIAGNOSIS — I4891 Unspecified atrial fibrillation: Secondary | ICD-10-CM | POA: Diagnosis not present

## 2022-02-19 DIAGNOSIS — H353132 Nonexudative age-related macular degeneration, bilateral, intermediate dry stage: Secondary | ICD-10-CM | POA: Diagnosis not present

## 2022-02-19 DIAGNOSIS — H25811 Combined forms of age-related cataract, right eye: Secondary | ICD-10-CM | POA: Diagnosis not present

## 2022-02-19 DIAGNOSIS — H52213 Irregular astigmatism, bilateral: Secondary | ICD-10-CM | POA: Diagnosis not present

## 2022-02-19 DIAGNOSIS — H2511 Age-related nuclear cataract, right eye: Secondary | ICD-10-CM | POA: Diagnosis not present

## 2022-02-19 DIAGNOSIS — H259 Unspecified age-related cataract: Secondary | ICD-10-CM | POA: Diagnosis not present

## 2022-02-19 DIAGNOSIS — I10 Essential (primary) hypertension: Secondary | ICD-10-CM | POA: Diagnosis not present

## 2022-03-12 ENCOUNTER — Other Ambulatory Visit (HOSPITAL_COMMUNITY): Payer: Medicare Other

## 2022-03-19 DIAGNOSIS — H2512 Age-related nuclear cataract, left eye: Secondary | ICD-10-CM | POA: Diagnosis not present

## 2022-03-19 DIAGNOSIS — H52213 Irregular astigmatism, bilateral: Secondary | ICD-10-CM | POA: Diagnosis not present

## 2022-03-19 DIAGNOSIS — H353132 Nonexudative age-related macular degeneration, bilateral, intermediate dry stage: Secondary | ICD-10-CM | POA: Diagnosis not present

## 2022-03-19 DIAGNOSIS — I10 Essential (primary) hypertension: Secondary | ICD-10-CM | POA: Diagnosis not present

## 2022-03-19 DIAGNOSIS — I4891 Unspecified atrial fibrillation: Secondary | ICD-10-CM | POA: Diagnosis not present

## 2022-03-19 DIAGNOSIS — H5703 Miosis: Secondary | ICD-10-CM | POA: Diagnosis not present

## 2022-03-19 DIAGNOSIS — H259 Unspecified age-related cataract: Secondary | ICD-10-CM | POA: Diagnosis not present

## 2022-03-19 DIAGNOSIS — H25812 Combined forms of age-related cataract, left eye: Secondary | ICD-10-CM | POA: Diagnosis not present

## 2022-03-19 DIAGNOSIS — H43813 Vitreous degeneration, bilateral: Secondary | ICD-10-CM | POA: Diagnosis not present

## 2022-03-20 ENCOUNTER — Other Ambulatory Visit (HOSPITAL_COMMUNITY): Payer: Medicare Other

## 2022-04-09 ENCOUNTER — Telehealth: Payer: Self-pay | Admitting: Internal Medicine

## 2022-04-09 NOTE — Telephone Encounter (Signed)
Returned call to Pt.  He has noticed that his heart rates are less controlled and having some shortness of breath.  He is taking metoprolol tartrate 50 mg PO BID as recommended at last office visit.  He would like an appointment to discuss medication change for better rate control.  Pt scheduled to see AT 04/17/2022.

## 2022-04-09 NOTE — Telephone Encounter (Signed)
Patient c/o Palpitations:  High priority if patient c/o lightheadedness, shortness of breath, or chest pain  How long have you had palpitations/irregular HR/ Afib? Are you having the symptoms now?  HR has been irregular over the last few months, gradually becoming becoming worse. No symptoms currently.  Are you currently experiencing lightheadedness, SOB or CP?  Not currently  Do you have a history of afib (atrial fibrillation) or irregular heart rhythm?  Yes   Have you checked your BP or HR? (document readings if available):  Patient states HR has been over 100, but it fluctuates anywhere between 70's and 80's - 100+ BP is around 140/100, but also varies  Are you experiencing any other symptoms?  SOB, shakiness occasionally

## 2022-04-16 DIAGNOSIS — H524 Presbyopia: Secondary | ICD-10-CM | POA: Diagnosis not present

## 2022-04-17 ENCOUNTER — Ambulatory Visit: Payer: Medicare Other | Admitting: Student

## 2022-04-17 ENCOUNTER — Encounter: Payer: Self-pay | Admitting: Student

## 2022-04-17 VITALS — BP 116/88 | HR 82 | Ht 69.5 in | Wt 224.4 lb

## 2022-04-17 DIAGNOSIS — G4733 Obstructive sleep apnea (adult) (pediatric): Secondary | ICD-10-CM

## 2022-04-17 DIAGNOSIS — I10 Essential (primary) hypertension: Secondary | ICD-10-CM

## 2022-04-17 DIAGNOSIS — I4819 Other persistent atrial fibrillation: Secondary | ICD-10-CM | POA: Diagnosis not present

## 2022-04-17 DIAGNOSIS — I483 Typical atrial flutter: Secondary | ICD-10-CM

## 2022-04-17 LAB — BASIC METABOLIC PANEL
BUN/Creatinine Ratio: 15 (ref 10–24)
BUN: 14 mg/dL (ref 8–27)
CO2: 25 mmol/L (ref 20–29)
Calcium: 9.3 mg/dL (ref 8.6–10.2)
Chloride: 103 mmol/L (ref 96–106)
Creatinine, Ser: 0.92 mg/dL (ref 0.76–1.27)
Glucose: 97 mg/dL (ref 70–99)
Potassium: 5 mmol/L (ref 3.5–5.2)
Sodium: 138 mmol/L (ref 134–144)
eGFR: 86 mL/min/{1.73_m2} (ref >59)

## 2022-04-17 LAB — CBC
Hematocrit: 44.6 % (ref 37.5–51.0)
Hemoglobin: 14.9 g/dL (ref 13.0–17.7)
MCH: 28.8 pg (ref 26.6–33.0)
MCHC: 33.4 g/dL (ref 31.5–35.7)
MCV: 86 fL (ref 79–97)
Platelets: 261 10*3/uL (ref 150–450)
RBC: 5.18 x10E6/uL (ref 4.14–5.80)
RDW: 11.4 % — ABNORMAL LOW (ref 11.6–15.4)
WBC: 6.7 10*3/uL (ref 3.4–10.8)

## 2022-04-17 MED ORDER — METOPROLOL SUCCINATE ER 100 MG PO TB24: 100.0000 mg | ORAL_TABLET | Freq: Every day | ORAL | 3 refills | Status: DC

## 2022-04-17 NOTE — Patient Instructions (Signed)
Medication Instructions:  Your physician has recommended you make the following change in your medication:   DISCONTINUE: Metoprolol Tartrate START: Metoprolol Succinate '100mg'$  daily at bedtime  *If you need a refill on your cardiac medications before your next appointment, please call your pharmacy*   Lab Work: TODAY: BMET, CBC  If you have labs (blood work) drawn today and your tests are completely normal, you will receive your results only by: [place] (if you have MyChart) OR A paper copy in the mail If you have any lab test that is abnormal or we need to change your treatment, we will call you to review the results.   Follow-Up: At [facility], you and your health needs are our priority.  As part of our continuing mission to provide you with exceptional heart care, we have created designated Provider Care Teams.  These Care Teams include your primary Cardiologist (physician) and Advanced Practice Providers (APPs -  Physician Assistants and Nurse Practitioners) who all work together to provide you with the care you need, when you need it.   Your next appointment:   3 month(s)  The format for your next appointment:   In Person  Provider:   Cristopher Peru, MD{
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
## 2022-05-22 ENCOUNTER — Ambulatory Visit (HOSPITAL_COMMUNITY): Payer: Medicare Other | Attending: Internal Medicine

## 2022-05-22 DIAGNOSIS — I4819 Other persistent atrial fibrillation: Secondary | ICD-10-CM

## 2022-05-22 DIAGNOSIS — I483 Typical atrial flutter: Secondary | ICD-10-CM | POA: Diagnosis not present

## 2022-05-22 LAB — ECHOCARDIOGRAM COMPLETE: S' Lateral: 3.1 cm

## 2022-07-23 ENCOUNTER — Encounter: Payer: Self-pay | Admitting: Internal Medicine

## 2022-07-23 ENCOUNTER — Ambulatory Visit: Payer: Medicare Other | Attending: Internal Medicine | Admitting: Internal Medicine

## 2022-07-23 ENCOUNTER — Ambulatory Visit (INDEPENDENT_AMBULATORY_CARE_PROVIDER_SITE_OTHER): Payer: Medicare Other

## 2022-07-23 VITALS — BP 138/86 | HR 92 | Ht 69.5 in | Wt 214.0 lb

## 2022-07-23 DIAGNOSIS — I4891 Unspecified atrial fibrillation: Secondary | ICD-10-CM

## 2022-07-23 DIAGNOSIS — I10 Essential (primary) hypertension: Secondary | ICD-10-CM | POA: Diagnosis not present

## 2022-07-23 NOTE — Patient Instructions (Addendum)
Medication Instructions:  Your physician recommends that you continue on your current medications as directed. Please refer to the Current Medication list given to you today.  *If you need a refill on your cardiac medications before your next appointment, please call your pharmacy*  Lab Work: None ordered.  If you have labs (blood work) drawn today and your tests are completely normal, you will receive your results only by: [place] (if you have MyChart) OR A paper copy in the mail If you have any lab test that is abnormal or we need to change your treatment, we will call you to review the results.  Testing/Procedures: 3 Day Zio Monitor ordered  Follow-Up: At [facility], you and your health needs are our priority.  As part of our continuing mission to provide you with exceptional heart care, we have created designated Provider Care Teams.  These Care Teams include your primary Cardiologist (physician) and Advanced Practice Providers (APPs -  Physician Assistants and Nurse Practitioners) who all work together to provide you with the care you need, when you need it.  We recommend signing up for the patient portal called "MyChart".  Sign up information is provided on this After Visit Summary.  MyChart is used to connect with patients for Virtual Visits (Telemedicine).  Patients are able to view lab/test results, encounter notes, upcoming appointments, etc.  Non-urgent messages can be sent to your provider as well.   To learn more about what you can do with MyChart, go to [email or web address].    Your next appointment:   You will follow up in 1 year with Dr. Lovena Le  The format for your next appointment:   In Person  Provider:   Cristopher Peru, MD{or one of the following Advanced Practice Providers on your designated Care Team:   Tommye Standard, [place] Legrand Como "Jonni Sanger" Chalmers Cater, [place]    Important Information About [facility] Monitor Instructions  Your  physician has requested you wear a ZIO patch monitor for 14 days.  This is a single patch monitor. [employer] supplies one patch monitor per enrollment. Additional stickers are not available. Please do not apply patch if you will be having a Nuclear Stress Test,  Echocardiogram, Cardiac CT, MRI, or Chest Xray during the period you would be wearing the  monitor. The patch cannot be worn during these tests. You cannot remove and re-apply the  ZIO XT patch monitor.  Your ZIO patch monitor will be mailed 3 day USPS to your address on file. It may take 3-5 days  to receive your monitor after you have been enrolled.  Once you have received your monitor, please review the enclosed instructions. Your monitor  has already been registered assigning a specific monitor serial # to you.  Billing and Patient Assistance Program Information  We have supplied [employer] with any of your insurance information on file for billing purposes. [employer] offers a sliding scale Patient Assistance Program for patients that do not have  insurance, or whose insurance does not completely cover the cost of the ZIO monitor.  You must apply for the Patient Assistance Program to qualify for this discounted rate.  To apply, please call [employer] at [phone], select option 4, select option 2, ask to apply for  Patient Assistance Program. Theodore Demark will ask your household income, and how many people  are in your household. They will quote your out-of-pocket cost based on that information.  [employer] will also be able to set up  a 1-month interest-free payment plan if needed.  Applying the monitor   Shave hair from upper left chest.  Hold abrader disc by orange tab. Rub abrader in 40 strokes over the upper left chest as  indicated in your monitor instructions.  Clean area with 4 enclosed alcohol pads. Let dry.  Apply patch as indicated in monitor instructions. Patch will be placed under collarbone on left  side of chest with arrow  pointing upward.  Rub patch adhesive wings for 2 minutes. Remove white label marked "1". Remove the white  label marked "2". Rub patch adhesive wings for 2 additional minutes.  While looking in a mirror, press and release button in center of patch. A small green light will  flash 3-4 times. This will be your only indicator that the monitor has been turned on.  Do not shower for the first 24 hours. You may shower after the first 24 hours.  Press the button if you feel a symptom. You will hear a small click. Record Date, Time and  Symptom in the Patient Logbook.  When you are ready to remove the patch, follow instructions on the last 2 pages of Patient  Logbook. Stick patch monitor onto the last page of Patient Logbook.  Place Patient Logbook in the blue and white box. Use locking tab on box and tape box closed  securely. The blue and white box has prepaid postage on it. Please place it in the mailbox as  soon as possible. Your physician should have your test results approximately 7 days after the  monitor has been mailed back to [facility]  Call IPalisadeat [phone] you have questions regarding  your ZIO XT patch monitor. Call them immediately if you see an orange light blinking on your  monitor.  If your monitor falls off in less than 4 days, contact our Monitor department at [phone]  If your monitor becomes loose or falls off after 4 days call [employer] at [phone]  suggestions on securing your monitor
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
## 2022-07-23 NOTE — Progress Notes (Unsigned)
Enrolled for [employer] to mail a ZIO XT long term holter monitor to the patients address on file.  

## 2022-07-26 DIAGNOSIS — I10 Essential (primary) hypertension: Secondary | ICD-10-CM

## 2022-07-26 DIAGNOSIS — I4891 Unspecified atrial fibrillation: Secondary | ICD-10-CM

## 2022-08-06 DIAGNOSIS — I10 Essential (primary) hypertension: Secondary | ICD-10-CM | POA: Diagnosis not present

## 2022-08-06 DIAGNOSIS — I4891 Unspecified atrial fibrillation: Secondary | ICD-10-CM | POA: Diagnosis not present

## 2022-08-09 ENCOUNTER — Other Ambulatory Visit: Payer: Self-pay | Admitting: Internal Medicine

## 2022-08-09 NOTE — Telephone Encounter (Signed)
Prescription refill request for Xarelto received.  Indication:Afib Last office visit:9/23 Weight:97.1 kg Age:76 Scr:0.9 CrCl:92.9 ml/min  Prescription refilled

## 2022-10-29 DIAGNOSIS — C61 Malignant neoplasm of prostate: Secondary | ICD-10-CM | POA: Diagnosis not present

## 2022-10-29 DIAGNOSIS — Z79899 Other long term (current) drug therapy: Secondary | ICD-10-CM | POA: Diagnosis not present

## 2022-10-29 DIAGNOSIS — I10 Essential (primary) hypertension: Secondary | ICD-10-CM | POA: Diagnosis not present

## 2022-10-29 DIAGNOSIS — R7303 Prediabetes: Secondary | ICD-10-CM | POA: Diagnosis not present

## 2022-10-29 DIAGNOSIS — Z0000 Encounter for general adult medical examination without abnormal findings: Secondary | ICD-10-CM | POA: Diagnosis not present

## 2022-11-18 DIAGNOSIS — M4316 Spondylolisthesis, lumbar region: Secondary | ICD-10-CM | POA: Diagnosis not present

## 2022-11-18 DIAGNOSIS — M47816 Spondylosis without myelopathy or radiculopathy, lumbar region: Secondary | ICD-10-CM | POA: Diagnosis not present

## 2022-12-11 DIAGNOSIS — H353131 Nonexudative age-related macular degeneration, bilateral, early dry stage: Secondary | ICD-10-CM | POA: Diagnosis not present

## 2023-02-15 ENCOUNTER — Other Ambulatory Visit: Payer: Self-pay | Admitting: Internal Medicine

## 2023-02-15 DIAGNOSIS — I4891 Unspecified atrial fibrillation: Secondary | ICD-10-CM

## 2023-02-17 NOTE — Telephone Encounter (Signed)
Prescription refill request for Xarelto received.  Indication: Afib  Last office visit: 07/23/22 Ladona Ridgel)  Weight: 97.1kg Age: 77 Scr: 0.92 (04/17/22)  CrCl: 90.64ml/min  Appropriate dose. Refill sent.

## 2023-04-02 DIAGNOSIS — K080 Exfoliation of teeth due to systemic causes: Secondary | ICD-10-CM | POA: Diagnosis not present

## 2023-05-08 DIAGNOSIS — I5042 Chronic combined systolic (congestive) and diastolic (congestive) heart failure: Secondary | ICD-10-CM | POA: Diagnosis not present

## 2023-05-08 DIAGNOSIS — I700 Atherosclerosis of aorta: Secondary | ICD-10-CM | POA: Diagnosis not present

## 2023-05-08 DIAGNOSIS — M1711 Unilateral primary osteoarthritis, right knee: Secondary | ICD-10-CM | POA: Diagnosis not present

## 2023-05-08 DIAGNOSIS — D6869 Other thrombophilia: Secondary | ICD-10-CM | POA: Diagnosis not present

## 2023-05-08 DIAGNOSIS — M25561 Pain in right knee: Secondary | ICD-10-CM | POA: Diagnosis not present

## 2023-05-08 DIAGNOSIS — I4821 Permanent atrial fibrillation: Secondary | ICD-10-CM | POA: Diagnosis not present

## 2023-05-20 ENCOUNTER — Other Ambulatory Visit: Payer: Self-pay | Admitting: Student

## 2023-06-10 DIAGNOSIS — M1711 Unilateral primary osteoarthritis, right knee: Secondary | ICD-10-CM | POA: Diagnosis not present

## 2023-07-31 DIAGNOSIS — M1711 Unilateral primary osteoarthritis, right knee: Secondary | ICD-10-CM | POA: Diagnosis not present

## 2023-08-11 DIAGNOSIS — H903 Sensorineural hearing loss, bilateral: Secondary | ICD-10-CM | POA: Diagnosis not present

## 2023-08-26 ENCOUNTER — Other Ambulatory Visit: Payer: Self-pay | Admitting: Internal Medicine

## 2023-08-26 DIAGNOSIS — I4891 Unspecified atrial fibrillation: Secondary | ICD-10-CM

## 2023-08-26 NOTE — Telephone Encounter (Signed)
Prescription refill request for Xarelto received.  Indication: AF Last office visit: 07/23/22  Rosette Reveal MD Weight: 97.1kg Age: 77 Scr: 0.92 on 10/29/22  [facility] CrCl: 89.42  Based on above findings Xarelto 20mg  daily is the appropriate dose.  Refill approved.

## 2023-10-15 DIAGNOSIS — K080 Exfoliation of teeth due to systemic causes: Secondary | ICD-10-CM | POA: Diagnosis not present

## 2023-11-03 DIAGNOSIS — Z1331 Encounter for screening for depression: Secondary | ICD-10-CM | POA: Diagnosis not present

## 2023-11-03 DIAGNOSIS — Z79899 Other long term (current) drug therapy: Secondary | ICD-10-CM | POA: Diagnosis not present

## 2023-11-03 DIAGNOSIS — Z0000 Encounter for general adult medical examination without abnormal findings: Secondary | ICD-10-CM | POA: Diagnosis not present

## 2023-11-03 DIAGNOSIS — I10 Essential (primary) hypertension: Secondary | ICD-10-CM | POA: Diagnosis not present

## 2023-11-03 DIAGNOSIS — R7301 Impaired fasting glucose: Secondary | ICD-10-CM | POA: Diagnosis not present

## 2023-11-03 LAB — LAB REPORT - SCANNED
A1c: 5.6
EGFR: 90

## 2023-12-11 DIAGNOSIS — M1711 Unilateral primary osteoarthritis, right knee: Secondary | ICD-10-CM | POA: Diagnosis not present

## 2023-12-30 ENCOUNTER — Ambulatory Visit: Payer: Medicare Other

## 2024-02-26 ENCOUNTER — Other Ambulatory Visit: Payer: Self-pay | Admitting: Internal Medicine

## 2024-02-26 DIAGNOSIS — I4891 Unspecified atrial fibrillation: Secondary | ICD-10-CM

## 2024-02-26 NOTE — Telephone Encounter (Signed)
 Pt returned call and transferred to schedulers.

## 2024-02-26 NOTE — Telephone Encounter (Signed)
 Prescription refill request for Eliquis received. Indication: Afib  Last office visit: 07/12/22 Carolynne Citron)  Scr: 0.81 (11/03/23)  Age: 78 Weight: 97.1kg  Appropriate dose. Office visit overdue. Called pt, no answer. Left message on voicemail.

## 2024-03-01 NOTE — Telephone Encounter (Signed)
 Pt has scheduled appt on 03/02/24 with Dr Carolynne Citron. Refill sent.

## 2024-03-02 ENCOUNTER — Encounter: Payer: Self-pay | Admitting: Internal Medicine

## 2024-03-02 ENCOUNTER — Ambulatory Visit: Attending: Internal Medicine | Admitting: Internal Medicine

## 2024-03-02 VITALS — BP 126/80 | HR 62 | Ht 70.0 in | Wt 221.4 lb

## 2024-03-02 DIAGNOSIS — I4891 Unspecified atrial fibrillation: Secondary | ICD-10-CM | POA: Diagnosis not present
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
## 2024-03-02 NOTE — Patient Instructions (Signed)

## 2024-03-04 DIAGNOSIS — Z9849 Cataract extraction status, unspecified eye: Secondary | ICD-10-CM | POA: Diagnosis not present

## 2024-03-04 DIAGNOSIS — D6869 Other thrombophilia: Secondary | ICD-10-CM | POA: Diagnosis not present

## 2024-05-12 DIAGNOSIS — M1711 Unilateral primary osteoarthritis, right knee: Secondary | ICD-10-CM | POA: Diagnosis not present

## 2024-06-03 ENCOUNTER — Other Ambulatory Visit: Payer: Self-pay | Admitting: Internal Medicine

## 2024-06-03 DIAGNOSIS — I4891 Unspecified atrial fibrillation: Secondary | ICD-10-CM

## 2024-06-03 NOTE — Telephone Encounter (Signed)
 Prescription refill request for Xarelto  received.  Indication: Afib  Last office visit: 03/02/24 Basilio)  Weight: 100.4mg  Age: 78 Scr: 0.81 (11/03/23)  CrCl: 105.78ml/min  Appropriate dose. Refill sent.

## 2024-06-07 ENCOUNTER — Other Ambulatory Visit: Payer: Self-pay

## 2024-06-07 MED ORDER — METOPROLOL SUCCINATE ER 100 MG PO TB24
100.0000 mg | ORAL_TABLET | Freq: Every day | ORAL | 2 refills | Status: AC
Start: 2024-06-07 — End: ?

## 2024-06-16 DIAGNOSIS — K080 Exfoliation of teeth due to systemic causes: Secondary | ICD-10-CM | POA: Diagnosis not present

## 2024-09-08 DIAGNOSIS — H353131 Nonexudative age-related macular degeneration, bilateral, early dry stage: Secondary | ICD-10-CM | POA: Diagnosis not present

## 2024-09-22 DIAGNOSIS — E7800 Pure hypercholesterolemia, unspecified: Secondary | ICD-10-CM | POA: Diagnosis not present

## 2024-09-22 DIAGNOSIS — G4733 Obstructive sleep apnea (adult) (pediatric): Secondary | ICD-10-CM | POA: Diagnosis not present

## 2024-09-22 DIAGNOSIS — Z23 Encounter for immunization: Secondary | ICD-10-CM | POA: Diagnosis not present

## 2024-09-22 DIAGNOSIS — I5042 Chronic combined systolic (congestive) and diastolic (congestive) heart failure: Secondary | ICD-10-CM | POA: Diagnosis not present

## 2024-09-22 DIAGNOSIS — I10 Essential (primary) hypertension: Secondary | ICD-10-CM | POA: Diagnosis not present

## 2024-09-22 DIAGNOSIS — R7303 Prediabetes: Secondary | ICD-10-CM | POA: Diagnosis not present

## 2024-09-22 DIAGNOSIS — I4891 Unspecified atrial fibrillation: Secondary | ICD-10-CM | POA: Diagnosis not present

## 2024-09-24 DIAGNOSIS — M1711 Unilateral primary osteoarthritis, right knee: Secondary | ICD-10-CM | POA: Diagnosis not present

## 2024-09-24 DIAGNOSIS — G4733 Obstructive sleep apnea (adult) (pediatric): Secondary | ICD-10-CM | POA: Diagnosis not present

## 2024-09-24 DIAGNOSIS — I4819 Other persistent atrial fibrillation: Secondary | ICD-10-CM | POA: Diagnosis not present

## 2024-09-24 DIAGNOSIS — I10 Essential (primary) hypertension: Secondary | ICD-10-CM | POA: Diagnosis not present

## 2024-09-28 ENCOUNTER — Emergency Department (HOSPITAL_COMMUNITY)
Admission: EM | Admit: 2024-09-28 | Discharge: 2024-09-28 | Disposition: A | Discharge status: Home / Self Care | Attending: Emergency Medicine | Admitting: Emergency Medicine

## 2024-09-28 ENCOUNTER — Emergency Department (HOSPITAL_COMMUNITY)

## 2024-09-28 ENCOUNTER — Encounter (HOSPITAL_COMMUNITY): Payer: Self-pay

## 2024-09-28 DIAGNOSIS — I483 Typical atrial flutter: Secondary | ICD-10-CM | POA: Insufficient documentation

## 2024-09-28 DIAGNOSIS — R079 Chest pain, unspecified: Secondary | ICD-10-CM | POA: Diagnosis not present

## 2024-09-28 DIAGNOSIS — R0989 Other specified symptoms and signs involving the circulatory and respiratory systems: Secondary | ICD-10-CM | POA: Diagnosis not present

## 2024-09-28 DIAGNOSIS — I4892 Unspecified atrial flutter: Secondary | ICD-10-CM | POA: Diagnosis not present

## 2024-09-28 DIAGNOSIS — R0789 Other chest pain: Secondary | ICD-10-CM | POA: Diagnosis not present

## 2024-09-28 LAB — CBC WITH DIFFERENTIAL/PLATELET
Abs Immature Granulocytes: 0.06 K/uL (ref 0.00–0.07)
Basophils Absolute: 0.1 K/uL (ref 0.0–0.1)
Basophils Relative: 1 %
Eosinophils Absolute: 0.1 K/uL (ref 0.0–0.5)
Eosinophils Relative: 1 %
HCT: 49.4 % (ref 39.0–52.0)
Hemoglobin: 16.8 g/dL (ref 13.0–17.0)
Immature Granulocytes: 1 %
Lymphocytes Relative: 20 %
Lymphs Abs: 2.2 K/uL (ref 0.7–4.0)
MCH: 30.3 pg (ref 26.0–34.0)
MCHC: 34 g/dL (ref 30.0–36.0)
MCV: 89 fL (ref 80.0–100.0)
Monocytes Absolute: 1.1 K/uL — ABNORMAL HIGH (ref 0.1–1.0)
Monocytes Relative: 10 %
Neutro Abs: 7.6 K/uL (ref 1.7–7.7)
Neutrophils Relative %: 67 %
Platelets: 242 K/uL (ref 150–400)
RBC: 5.55 MIL/uL (ref 4.22–5.81)
RDW: 11.9 % (ref 11.5–15.5)
WBC: 11.2 K/uL — ABNORMAL HIGH (ref 4.0–10.5)
nRBC: 0 % (ref 0.0–0.2)

## 2024-09-28 LAB — COMPREHENSIVE METABOLIC PANEL WITH GFR
ALT: 28 U/L (ref 0–44)
AST: 19 U/L (ref 15–41)
Albumin: 3.6 g/dL (ref 3.5–5.0)
Alkaline Phosphatase: 66 U/L (ref 38–126)
Anion gap: 9 (ref 5–15)
BUN: 17 mg/dL (ref 8–23)
CO2: 27 mmol/L (ref 22–32)
Calcium: 8.7 mg/dL — ABNORMAL LOW (ref 8.9–10.3)
Chloride: 102 mmol/L (ref 98–111)
Creatinine, Ser: 0.99 mg/dL (ref 0.61–1.24)
GFR, Estimated: >60 mL/min (ref >60)
Glucose, Bld: 112 mg/dL — ABNORMAL HIGH (ref 70–99)
Potassium: 4.4 mmol/L (ref 3.5–5.1)
Sodium: 138 mmol/L (ref 135–145)
Total Bilirubin: 1 mg/dL (ref 0.0–1.2)
Total Protein: 6.8 g/dL (ref 6.5–8.1)

## 2024-09-28 LAB — URINALYSIS, ROUTINE W REFLEX MICROSCOPIC
Bilirubin Urine: NEGATIVE
Glucose, UA: NEGATIVE mg/dL
Hgb urine dipstick: NEGATIVE
Ketones, ur: NEGATIVE mg/dL
Leukocytes,Ua: NEGATIVE
Nitrite: NEGATIVE
Protein, ur: NEGATIVE mg/dL
Specific Gravity, Urine: 1.008 (ref 1.005–1.030)
pH: 6 (ref 5.0–8.0)

## 2024-09-28 LAB — TROPONIN I (HIGH SENSITIVITY): Troponin I (High Sensitivity): 26 ng/L — ABNORMAL HIGH (ref <18)

## 2024-09-28 LAB — PROTIME-INR
INR: 1.3 — ABNORMAL HIGH (ref 0.8–1.2)
Prothrombin Time: 17.4 s — ABNORMAL HIGH (ref 11.4–15.2)

## 2024-09-28 LAB — MAGNESIUM: Magnesium: 2 mg/dL (ref 1.7–2.4)

## 2024-09-28 MED ORDER — PROPOFOL 10 MG/ML IV BOLUS
0.5000 mg/kg | Freq: Once | INTRAVENOUS | Status: DC
  Filled 2024-09-28: qty 20

## 2024-09-28 MED ORDER — PROPOFOL 10 MG/ML IV BOLUS
INTRAVENOUS | Status: DC | PRN
  Administered 2024-09-28: 50 ug via INTRAVENOUS

## 2024-09-28 NOTE — ED Triage Notes (Signed)
 Pt reports he feels like he his heart rate is 120 bpm. Reports ongoing issue, doctor upped his metoprolol  to twice per day which helped for a few days but then increased again. Reports mild chest discomfort at this time

## 2024-09-28 NOTE — ED Provider Triage Note (Addendum)
 Emergency Medicine Provider Triage Evaluation Note  Travis Chase , a 78 y.o. male  was evaluated in triage.  Pt complains of arrhythmia. Feels like his heart rate is around 120 bpm. Has been for a little over one week now. Has been going up and down. Saw family doctor last week who upped his metoprolol  to twice per day which stabilized him for a few days but then increased again. Very mild chest discomfort but otherwise feels well, no lightheadedness. Feeling antsy. Takes xarelto , no missed doses.   Review of Systems  Positive: Chest palpitations, tachycardia Negative: Chest pain, SOB, fevers chills, nausea/vomiting, abdominal pain  Physical Exam  There were no vitals taken for this visit. Gen:   Awake, no distress   Resp:  Normal effort  Cardiac:  Regular tachycardic rate/rhythm MSK:   Moves extremities without difficulty   Medical Decision Making  Medically screening exam initiated at 11:04 AM.  Appropriate orders placed.  Travis Chase was informed that the remainder of the evaluation will be completed by another provider, this initial triage assessment does not replace that evaluation, and the importance of remaining in the ED until their evaluation is complete.  EKG shows regular rhythm 120 bpm concerning for aflutter 2:1 conduction. Patient w/ stable BP. Will be moved to a treatment space.       Franklyn Sid SAILOR, MD 09/28/24 [phone]

## 2024-09-28 NOTE — ED Provider Notes (Signed)
 [place] EMERGENCY DEPARTMENT AT [facility] Provider Note   CSN: [id number] Arrival date & time: 09/28/24  1051     Patient presents with: No chief complaint on file.   Travis Chase is a 78 y.o. male.   78 yo M with chief complaints of his heart rate being too fast.  Patient has had a diagnosis of atrial fibrillation for about 10 years or so.  States he is compliant with his anticoagulation.  He has felt a little bit fatigued for the past couple days.  Has felt a little bit of chest pressure as well.  Tells me the chest pressure is not typical for his atrial fibrillation.  Saw his family doctor and was found to have a heart rate in the 120s.  Told to come to the ED for evaluation.  He had his metoprolol  changed recently but otherwise denies medication change.  Denies cough congestion or fever.  Has nausea vomiting or diarrhea.  Denies dark stool or blood in his stool.  He denies any change to his over-the-counter medications.  No new eyedrops.  No new supplements.        Prior to Admission medications   Medication Sig Start Date End Date Taking? Authorizing Provider  GLUCOSAMINE-CHONDROITIN PO Take 2 tablets by mouth every evening.    [provider]  metoprolol  succinate (TOPROL -XL) 100 MG 24 hr tablet Take 1 tablet (100 mg total) by mouth daily. TAKE WITH OR IMMEDIATELY FOLLOWING A MEAL. 06/07/24   Waddell Danelle ORN, MD  Multiple Vitamins-Minerals (PRESERVISION AREDS 2+MULTI VIT PO) Take 1 capsule by mouth daily.    [provider]  Omega-3 Fatty Acids (FISH OIL PO) Take 1 capsule by mouth daily.    [provider]  rivaroxaban  (XARELTO ) 20 MG TABS tablet TAKE 1 TABLET BY MOUTH DAILY WITH SUPPER 06/03/24   Waddell Danelle ORN, MD  Turmeric 500 MG CAPS Take 500 mg by mouth daily.    [provider]    Allergies: Patient has no known allergies.    Review of Systems  Updated Vital Signs BP 102/75   Pulse 61   Temp 97.6 F (36.4 C)  (Oral)   Resp 18   Wt 100.4 kg   SpO2 97%   BMI 31.76 kg/m   Physical Exam Vitals and nursing note reviewed.  Constitutional:      Appearance: He is well-developed.  HENT:     Head: Normocephalic and atraumatic.  Eyes:     Pupils: Pupils are equal, round, and reactive to light.  Neck:     Vascular: No JVD.  Cardiovascular:     Rate and Rhythm: Regular rhythm. Tachycardia present.     Heart sounds: No murmur heard.    No friction rub. No gallop.  Pulmonary:     Effort: No respiratory distress.     Breath sounds: No wheezing.  Abdominal:     General: There is no distension.     Tenderness: There is no abdominal tenderness. There is no guarding or rebound.  Musculoskeletal:        General: Normal range of motion.     Cervical back: Normal range of motion and neck supple.  Skin:    Coloration: Skin is not pale.     Findings: No rash.  Neurological:     Mental Status: He is alert and oriented to person, place, and time.  Psychiatric:        Behavior: Behavior normal.     (  all labs ordered are listed, but only abnormal results are displayed) Labs Reviewed  CBC WITH DIFFERENTIAL/PLATELET - Abnormal; Notable for the following components:      Result Value   WBC 11.2 (*)    Monocytes Absolute 1.1 (*)    All other components within normal limits  COMPREHENSIVE METABOLIC PANEL WITH GFR - Abnormal; Notable for the following components:   Glucose, Bld 112 (*)    Calcium 8.7 (*)    All other components within normal limits  PROTIME-INR - Abnormal; Notable for the following components:   Prothrombin Time 17.4 (*)    INR 1.3 (*)    All other components within normal limits  TROPONIN I (HIGH SENSITIVITY) - Abnormal; Notable for the following components:   Troponin I (High Sensitivity) 26 (*)    All other components within normal limits  MAGNESIUM  URINALYSIS, ROUTINE W REFLEX MICROSCOPIC    EKG: EKG Interpretation Date/Time:  Tuesday September 28 2024 10:57:50  EST Ventricular Rate:  121 PR Interval:    QRS Duration:  86 QT Interval:  314 QTC Calculation: 445 R Axis:   36  Text Interpretation: A flutter 2:1 conduction Confirmed by Franklyn Gills [phone]) on 09/28/2024 11:33:45 AM  Radiology: [facility] Chest Port 1 View Result Date: 09/28/2024 CLINICAL DATA:  Chest pain. EXAM: PORTABLE CHEST 1 VIEW COMPARISON:  07/19/2018. FINDINGS: The heart size and mediastinal contours are within normal limits. Low lung volumes. No focal consolidation, pleural effusion, or pneumothorax. No acute osseous abnormality. IMPRESSION: No acute cardiopulmonary findings. Electronically Signed   By: Harrietta Sherry M.D.   On: 09/28/2024 13:12     .Cardioversion  Date/Time: 09/28/2024 2:02 PM  Performed by: Emil Share, DO Authorized by: Emil Share, DO   Consent:    Consent obtained:  Verbal   Consent given by:  Patient   Risks discussed:  Cutaneous burn, death and induced arrhythmia Pre-procedure details:    Cardioversion basis:  Emergent   Rhythm:  Atrial flutter   Electrode placement:  Anterior-posterior Patient sedated: Yes. Refer to sedation procedure documentation for details of sedation.  Attempt one:    Cardioversion mode:  Synchronous   Waveform:  Biphasic   Shock (Joules):  100   Shock outcome:  Conversion to normal sinus rhythm .Sedation  Date/Time: 09/28/2024 2:03 PM  Performed by: Emil Share, DO Authorized by: Emil Share, DO   Consent:    Consent obtained:  Verbal and written   Consent given by:  Patient   Risks discussed:  Allergic reaction, dysrhythmia, inadequate sedation and nausea   Alternatives discussed:  Analgesia without sedation Universal protocol:    Immediately prior to procedure, a time out was called: yes     Patient identity confirmed:  Anonymous protocol, patient vented/unresponsive Indications:    Procedure necessitating sedation performed by:  Physician performing sedation Pre-sedation assessment:    Time since last food  or drink:  6   ASA classification: class 2 - patient with mild systemic disease     Mouth opening:  2 finger widths   Thyromental distance:  3 finger widths   Mallampati score:  II - soft palate, uvula, fauces visible   Neck mobility: normal     Pre-sedation assessments completed and reviewed: airway patency, cardiovascular function, hydration status, mental status, nausea/vomiting, pain level, respiratory function and temperature   A pre-sedation assessment was completed prior to the start of the procedure Immediate pre-procedure details:    Reassessment: Patient reassessed immediately prior to procedure  Reviewed: vital signs, relevant labs/tests and NPO status     Verified: bag valve mask available, intubation equipment available, IV patency confirmed and oxygen available   Procedure details (see MAR for exact dosages):    Preoxygenation:  Nasal cannula   Sedation:  Propofol    Intended level of sedation: moderate (conscious sedation)   Analgesia:  None   Intra-procedure monitoring:  Blood pressure monitoring, cardiac monitor, continuous capnometry, continuous pulse oximetry, frequent LOC assessments and frequent vital sign checks   Intra-procedure events: none     Total Provider sedation time (minutes):  35 Post-procedure details:   A post-sedation assessment was completed following the completion of the procedure.   Attendance: Constant attendance by certified staff until patient recovered     Recovery: Patient returned to pre-procedure baseline     Post-sedation assessments completed and reviewed: airway patency, cardiovascular function, hydration status, mental status, nausea/vomiting, pain level, respiratory function and temperature     Patient is stable for discharge or admission: yes     Procedure completion:  Tolerated well, no immediate complications .Critical Care  Performed by: Emil Share, DO Authorized by: Emil Share, DO   Critical care provider statement:    Critical  care time (minutes):  35   Critical care time was exclusive of:  Separately billable procedures and treating other patients   Critical care was time spent personally by me on the following activities:  Development of treatment plan with patient or surrogate, discussions with consultants, evaluation of patient's response to treatment, examination of patient, ordering and review of laboratory studies, ordering and review of radiographic studies, ordering and performing treatments and interventions, pulse oximetry, re-evaluation of patient's condition and review of old charts   Care discussed with: admitting provider      Medications Ordered in the ED  propofol  (DIPRIVAN ) 10 mg/mL bolus/IV push 50.2 mg (has no administration in time range)  propofol  (DIPRIVAN ) 10 mg/mL bolus/IV push (50 mcg Intravenous Given 09/28/24 1353)                                    Medical Decision Making Amount and/or Complexity of Data Reviewed Radiology: ordered.   78 yo M with a chief complaints of chest pain and tachycardia.  Patient with heart rate in the 120s.  Seems to be somewhat fixed there most consistent with atrial flutter.  He sees electrophysiology.  On metoprolol  and Xarelto .  Sounds like it is paroxysmal.  I think patient likely is a candidate for cardioversion.  My only hesitancy is he does not typically have chest discomfort with this.  Will obtain a troponin.  Troponin 26.  I think this is unlikely to be an acute MI with 48 hours of symptoms and a heart rate of 122.  I think cleared of ACS.  Will cardiovert.  3:26 PM:  I have discussed the diagnosis/risks/treatment options with the patient.  Evaluation and diagnostic testing in the emergency department does not suggest an emergent condition requiring admission or immediate intervention beyond what has been performed at this time.  They will follow up with PCP. We also discussed returning to the ED immediately if new or worsening sx occur. We  discussed the sx which are most concerning (e.g., sudden worsening pain, fever, inability to tolerate by mouth) that necessitate immediate return. Medications administered to the patient during their visit and any new prescriptions provided to the patient are listed below.  Medications given during this visit Medications  propofol  (DIPRIVAN ) 10 mg/mL bolus/IV push 50.2 mg (has no administration in time range)  propofol  (DIPRIVAN ) 10 mg/mL bolus/IV push (50 mcg Intravenous Given 09/28/24 1353)     The patient appears reasonably screen and/or stabilized for discharge and I doubt any other medical condition or other [facility] requiring further screening, evaluation, or treatment in the ED at this time prior to discharge.       Final diagnoses:  Typical atrial flutter [facility])    ED Discharge Orders          Ordered    Ambulatory referral to Cardiology       Comments: If you have not heard from the Cardiology office within the next 72 hours please call [phone].   09/28/24 1502               Emil Share, DO 09/28/24 1526

## 2024-09-28 NOTE — Discharge Instructions (Signed)
 Call the afib clinic to set up an appointment.   Please return for worsening symptoms especially worsening chest discomfort.  Your electrophysiologist should call you to set up an appointment

## 2024-09-29 DIAGNOSIS — H26491 Other secondary cataract, right eye: Secondary | ICD-10-CM | POA: Diagnosis not present

## 2024-09-29 DIAGNOSIS — I483 Typical atrial flutter: Secondary | ICD-10-CM | POA: Diagnosis not present

## 2024-12-17 ENCOUNTER — Other Ambulatory Visit: Payer: Self-pay | Admitting: Internal Medicine

## 2024-12-17 DIAGNOSIS — I4891 Unspecified atrial fibrillation: Secondary | ICD-10-CM
